# Patient Record
Sex: Female | Born: 1960
Health system: Southern US, Community
[De-identification: ages and names within clinical notes are randomized; demographics above are authoritative.]

---

## 2002-08-29 ENCOUNTER — Other Ambulatory Visit: Admission: RE | Admit: 2002-08-29 | Discharge: 2002-08-29 | Payer: Self-pay | Admitting: *Deleted

## 2012-03-17 ENCOUNTER — Encounter: Payer: Self-pay | Admitting: Internal Medicine

## 2012-05-02 ENCOUNTER — Encounter: Payer: Self-pay | Admitting: Internal Medicine

## 2012-05-11 ENCOUNTER — Encounter: Payer: Self-pay | Admitting: Internal Medicine

## 2013-05-10 ENCOUNTER — Ambulatory Visit (INDEPENDENT_AMBULATORY_CARE_PROVIDER_SITE_OTHER): Payer: BC Managed Care – PPO | Admitting: Sports Medicine

## 2013-05-10 ENCOUNTER — Encounter: Payer: Self-pay | Admitting: Sports Medicine

## 2013-05-10 VITALS — BP 164/92 | Ht 68.0 in | Wt 160.0 lb

## 2013-05-10 DIAGNOSIS — M25561 Pain in right knee: Secondary | ICD-10-CM

## 2013-05-10 DIAGNOSIS — M25569 Pain in unspecified knee: Secondary | ICD-10-CM

## 2013-05-10 NOTE — Progress Notes (Signed)
   Subjective:    Patient ID: Judy Schneider, female    DOB: [birth year], 53 y.o.   MRN: [id number]  HPI chief complaint: Right knee pain  Very pleasant 53 year old female comes in today complaining of 6 months of right knee pain. No injury that she can recall but a gradual onset of pain that began initially while running. As a result, she stopped running but has recently begun to get pain with recreational walking as well. She describes a diffuse ache throughout the knee with some intermittent swelling in the posterior knee. No mechanical symptoms. She does  limp on occasion. She notices that walking downstairs makes her pain worse. Twisting the knee will also occasionally cause it to hurt. Compression seems to help as does over-the-counter Aleve twice daily. She gets occasional nighttime pain. She was having similar pain in the left knee several months ago which resolved spontaneously. No prior knee surgeries. No pain more proximally in the groin. No associated numbness or tingling. She is also an avid cyclist and notes that she has no pain with riding.  She is otherwise healthy. No chronic medical problems. No chronic medications. No prior surgeries. No known drug allergies.     Review of Systems    As above Objective:   Physical Exam Well-developed, well-nourished. No acute distress. Awake alert and oriented x3. Vital signs reviewed.  Right knee: Full range of motion. No effusion. Negative patellar compression test. No tenderness to palpation along the quadriceps or patellar tendons. There is some tenderness to palpation along the posterior lateral joint line with a positive Thessaly and an equivocal McMurray's. No tenderness to palpation along the medial joint line. Knee is stable to valgus and varus stressing. No tenderness along the distal IT band. Knee is stable to valgus and varus stressing. Negative anterior drawer. Negative posterior drawer. No palpable Baker's cyst. Neurovascularly intact  distally. Walking without a limp.  MSK ultrasound of the right knee was performed. Limited images were obtained. Patient has a trace effusion in the suprapatellar pouch. Visualized portion of the medial meniscus showed no obvious tear but there was some bulging from the joint line. Lateral meniscus was well visualized with no obvious abnormality. No obvious Baker's cyst.       Assessment & Plan:  Right knee pain-question degenerative lateral meniscal tear with intermittent Baker's cyst formation  I would like to get some plain x-rays of her knee to evaluate the degree of possible osteoarthritis present. She will start a home exercise program consisting of quadriceps, hamstring, and hip abductor strengthening. Continue with compression as needed. Continue with over-the-counter Aleve as needed. I think she should avoid recreational walking and running until followup with me in 3-4 weeks. She is okay to continue cycling. If symptoms persist a followup I would consider merits of a diagnostic/therapeutic intra-articular cortisone injection. Patient will call with questions or concerns in the interim.

## 2013-05-12 ENCOUNTER — Ambulatory Visit
Admission: RE | Admit: 2013-05-12 | Discharge: 2013-05-12 | Disposition: A | Discharge status: Home / Self Care | Payer: BC Managed Care – PPO | Source: Ambulatory Visit | Attending: Sports Medicine | Admitting: Sports Medicine

## 2013-05-12 DIAGNOSIS — M25561 Pain in right knee: Secondary | ICD-10-CM

## 2013-06-06 ENCOUNTER — Ambulatory Visit (INDEPENDENT_AMBULATORY_CARE_PROVIDER_SITE_OTHER): Payer: BC Managed Care – PPO | Admitting: Sports Medicine

## 2013-06-06 ENCOUNTER — Encounter: Payer: Self-pay | Admitting: Sports Medicine

## 2013-06-06 VITALS — BP 136/89 | Ht 68.0 in | Wt 160.0 lb

## 2013-06-06 DIAGNOSIS — M25569 Pain in unspecified knee: Secondary | ICD-10-CM

## 2013-06-06 DIAGNOSIS — M25579 Pain in unspecified ankle and joints of unspecified foot: Secondary | ICD-10-CM

## 2013-06-06 NOTE — Progress Notes (Signed)
   Subjective:    Patient ID: Judy Schneider, female    DOB: 1960/07/05, 53 y.o.   MRN: [id number]  HPI Patient comes in today for followup on right knee pain. Overall, symptoms persist. She still getting medial sided knee pain with certain activities such as twisting or walking downstairs. Pain is sharp in quality. She has been compliant with her home exercise program but despite that her symptoms persist. She is also complaining of some pain in the left foot. She questions whether or not she may have a Morton's neuroma. She describes several months of intermittent pain that she localizes between the second third metatarsal heads. Intermittent numbness and tingling into the second and third toes. Worse with walking. Improves at rest. Denies pain or numbness proximal to the metatarsal heads. She has not noticed any swelling.    Review of Systems     Objective:   Physical Exam  Right knee: Full range of motion. Trace effusion. She is tender to palpation along the medial joint line with pain but no popping with McMurray's. Positive Thessaly's. No tenderness to palpation along the lateral joint line. Knee is stable to ligamentous exam. No popliteal cyst. Neurovascularly intact distally.  Left foot: Mild tenderness to palpation between the second and third metatarsal heads. Positive Mudlers sign. No soft tissue swelling. No tenderness to palpation directly over the metatarsal heads themselves. Moderate collapse of the transverse arch. Neurovascularly intact distally. Walking without a limp.  X-rays of the right knee from 05/10/2013 are reviewed. Nothing acute. There is a paucity of degenerative change.       Assessment & Plan:  Right knee pain likely secondary to degenerative medial meniscal tear Left foot pain secondary to Morton's neuroma  I discussed her treatment options in regards to her right knee. We discussed the possibility of a cortisone injection versus further diagnostic imaging  versus a prolonged period of watchful waiting. She would like to think things over. I'll have her followup with me in about 4 weeks to discuss these options further. In the meantime, I've given her a pair of green sports insoles with a metatarsal pad on the left to hopefully alleviate some of the discomfort from her probable Morton's neuroma. If this becomes more symptomatic I would consider cortisone injection.

## 2013-07-18 ENCOUNTER — Ambulatory Visit: Payer: BC Managed Care – PPO | Admitting: Sports Medicine

## 2015-10-02 DIAGNOSIS — Z23 Encounter for immunization: Secondary | ICD-10-CM | POA: Diagnosis not present

## 2015-10-17 DIAGNOSIS — Z1231 Encounter for screening mammogram for malignant neoplasm of breast: Secondary | ICD-10-CM | POA: Diagnosis not present

## 2015-10-17 DIAGNOSIS — Z803 Family history of malignant neoplasm of breast: Secondary | ICD-10-CM | POA: Diagnosis not present

## 2015-10-22 DIAGNOSIS — Z713 Dietary counseling and surveillance: Secondary | ICD-10-CM | POA: Diagnosis not present

## 2015-11-18 DIAGNOSIS — Z713 Dietary counseling and surveillance: Secondary | ICD-10-CM | POA: Diagnosis not present

## 2015-12-02 DIAGNOSIS — Z713 Dietary counseling and surveillance: Secondary | ICD-10-CM | POA: Diagnosis not present

## 2015-12-17 DIAGNOSIS — Z713 Dietary counseling and surveillance: Secondary | ICD-10-CM | POA: Diagnosis not present

## 2016-01-14 DIAGNOSIS — Z713 Dietary counseling and surveillance: Secondary | ICD-10-CM | POA: Diagnosis not present

## 2016-01-28 DIAGNOSIS — Z713 Dietary counseling and surveillance: Secondary | ICD-10-CM | POA: Diagnosis not present

## 2016-09-02 DIAGNOSIS — Z713 Dietary counseling and surveillance: Secondary | ICD-10-CM | POA: Diagnosis not present

## 2016-09-15 DIAGNOSIS — Z01419 Encounter for gynecological examination (general) (routine) without abnormal findings: Secondary | ICD-10-CM | POA: Diagnosis not present

## 2016-09-15 DIAGNOSIS — Z130 Encounter for screening for diseases of the blood and blood-forming organs and certain disorders involving the immune mechanism: Secondary | ICD-10-CM | POA: Diagnosis not present

## 2016-09-15 DIAGNOSIS — Z0000 Encounter for general adult medical examination without abnormal findings: Secondary | ICD-10-CM | POA: Diagnosis not present

## 2016-09-15 DIAGNOSIS — Z13220 Encounter for screening for lipoid disorders: Secondary | ICD-10-CM | POA: Diagnosis not present

## 2016-09-15 DIAGNOSIS — Z1151 Encounter for screening for human papillomavirus (HPV): Secondary | ICD-10-CM | POA: Diagnosis not present

## 2016-09-15 DIAGNOSIS — Z6824 Body mass index (BMI) 24.0-24.9, adult: Secondary | ICD-10-CM | POA: Diagnosis not present

## 2016-09-15 DIAGNOSIS — Z131 Encounter for screening for diabetes mellitus: Secondary | ICD-10-CM | POA: Diagnosis not present

## 2016-09-16 DIAGNOSIS — Z713 Dietary counseling and surveillance: Secondary | ICD-10-CM | POA: Diagnosis not present

## 2016-10-14 DIAGNOSIS — Z713 Dietary counseling and surveillance: Secondary | ICD-10-CM | POA: Diagnosis not present

## 2016-10-28 DIAGNOSIS — Z713 Dietary counseling and surveillance: Secondary | ICD-10-CM | POA: Diagnosis not present

## 2016-10-28 DIAGNOSIS — Z803 Family history of malignant neoplasm of breast: Secondary | ICD-10-CM | POA: Diagnosis not present

## 2016-10-28 DIAGNOSIS — Z1231 Encounter for screening mammogram for malignant neoplasm of breast: Secondary | ICD-10-CM | POA: Diagnosis not present

## 2016-11-04 DIAGNOSIS — Z713 Dietary counseling and surveillance: Secondary | ICD-10-CM | POA: Diagnosis not present

## 2016-11-18 DIAGNOSIS — Z713 Dietary counseling and surveillance: Secondary | ICD-10-CM | POA: Diagnosis not present

## 2017-01-13 DIAGNOSIS — Z713 Dietary counseling and surveillance: Secondary | ICD-10-CM | POA: Diagnosis not present

## 2017-01-14 DIAGNOSIS — Z713 Dietary counseling and surveillance: Secondary | ICD-10-CM | POA: Diagnosis not present

## 2017-01-27 DIAGNOSIS — Z713 Dietary counseling and surveillance: Secondary | ICD-10-CM | POA: Diagnosis not present

## 2017-02-10 DIAGNOSIS — Z713 Dietary counseling and surveillance: Secondary | ICD-10-CM | POA: Diagnosis not present

## 2017-02-24 DIAGNOSIS — Z713 Dietary counseling and surveillance: Secondary | ICD-10-CM | POA: Diagnosis not present

## 2017-03-10 DIAGNOSIS — Z713 Dietary counseling and surveillance: Secondary | ICD-10-CM | POA: Diagnosis not present

## 2017-03-24 DIAGNOSIS — Z713 Dietary counseling and surveillance: Secondary | ICD-10-CM | POA: Diagnosis not present

## 2017-04-07 DIAGNOSIS — Z713 Dietary counseling and surveillance: Secondary | ICD-10-CM | POA: Diagnosis not present

## 2017-04-21 DIAGNOSIS — Z713 Dietary counseling and surveillance: Secondary | ICD-10-CM | POA: Diagnosis not present

## 2017-05-05 DIAGNOSIS — Z713 Dietary counseling and surveillance: Secondary | ICD-10-CM | POA: Diagnosis not present

## 2017-05-19 DIAGNOSIS — Z713 Dietary counseling and surveillance: Secondary | ICD-10-CM | POA: Diagnosis not present

## 2017-06-02 DIAGNOSIS — Z713 Dietary counseling and surveillance: Secondary | ICD-10-CM | POA: Diagnosis not present

## 2017-09-28 DIAGNOSIS — Z131 Encounter for screening for diabetes mellitus: Secondary | ICD-10-CM | POA: Diagnosis not present

## 2017-09-28 DIAGNOSIS — Z0000 Encounter for general adult medical examination without abnormal findings: Secondary | ICD-10-CM | POA: Diagnosis not present

## 2017-09-28 DIAGNOSIS — Z6825 Body mass index (BMI) 25.0-25.9, adult: Secondary | ICD-10-CM | POA: Diagnosis not present

## 2017-09-28 DIAGNOSIS — Z13220 Encounter for screening for lipoid disorders: Secondary | ICD-10-CM | POA: Diagnosis not present

## 2017-09-28 DIAGNOSIS — Z130 Encounter for screening for diseases of the blood and blood-forming organs and certain disorders involving the immune mechanism: Secondary | ICD-10-CM | POA: Diagnosis not present

## 2017-09-28 DIAGNOSIS — Z01419 Encounter for gynecological examination (general) (routine) without abnormal findings: Secondary | ICD-10-CM | POA: Diagnosis not present

## 2017-10-01 DIAGNOSIS — Z23 Encounter for immunization: Secondary | ICD-10-CM | POA: Diagnosis not present

## 2017-11-05 DIAGNOSIS — Z803 Family history of malignant neoplasm of breast: Secondary | ICD-10-CM | POA: Diagnosis not present

## 2017-11-05 DIAGNOSIS — Z1231 Encounter for screening mammogram for malignant neoplasm of breast: Secondary | ICD-10-CM | POA: Diagnosis not present

## 2017-11-08 DIAGNOSIS — Z713 Dietary counseling and surveillance: Secondary | ICD-10-CM | POA: Diagnosis not present

## 2017-11-17 DIAGNOSIS — Z713 Dietary counseling and surveillance: Secondary | ICD-10-CM | POA: Diagnosis not present

## 2017-11-18 DIAGNOSIS — Z713 Dietary counseling and surveillance: Secondary | ICD-10-CM | POA: Diagnosis not present

## 2017-11-24 DIAGNOSIS — Z713 Dietary counseling and surveillance: Secondary | ICD-10-CM | POA: Diagnosis not present

## 2017-12-08 DIAGNOSIS — Z713 Dietary counseling and surveillance: Secondary | ICD-10-CM | POA: Diagnosis not present

## 2017-12-22 DIAGNOSIS — Z713 Dietary counseling and surveillance: Secondary | ICD-10-CM | POA: Diagnosis not present

## 2018-01-19 DIAGNOSIS — Z713 Dietary counseling and surveillance: Secondary | ICD-10-CM | POA: Diagnosis not present

## 2018-09-06 ENCOUNTER — Other Ambulatory Visit: Payer: Self-pay

## 2018-09-06 ENCOUNTER — Ambulatory Visit (INDEPENDENT_AMBULATORY_CARE_PROVIDER_SITE_OTHER): Payer: BC Managed Care – PPO | Admitting: Sports Medicine

## 2018-09-06 VITALS — BP 156/91 | Ht 68.0 in | Wt 165.0 lb

## 2018-09-06 DIAGNOSIS — M25551 Pain in right hip: Secondary | ICD-10-CM | POA: Diagnosis not present

## 2018-09-06 MED ORDER — METHYLPREDNISOLONE ACETATE 80 MG/ML IJ SUSP
80.0000 mg | Freq: Once | INTRAMUSCULAR | Status: AC
  Administered 2018-09-06: 80 mg via INTRAMUSCULAR

## 2018-09-06 MED ORDER — MELOXICAM 15 MG PO TABS: ORAL_TABLET | ORAL | 0 refills | Status: DC

## 2018-09-06 MED ORDER — KETOROLAC TROMETHAMINE 60 MG/2ML IM SOLN
60.0000 mg | Freq: Once | INTRAMUSCULAR | Status: AC
  Administered 2018-09-06: 60 mg via INTRAMUSCULAR

## 2018-09-06 NOTE — Progress Notes (Signed)
   Subjective:    Patient ID: Judy Schneider, female    DOB: 1961/01/01, 58 y.o.   MRN: [id number]  CC: R-sided buttock/leg pain  HPI   Patient is a 58 year-old female presenting with (R) buttock and posterior leg pain. She states her pain began about 2 months ago. She denies any inciting event or trauma. She does report that she is very active--walking about 4 miles daily, biking, and doing light weight training. She reports progressive (R) posterior leg pain over these past two months. She states her pain is a a deeper, stabbing pain in her posterior buttock. The pain will radiated down her posterior leg as well. She feels it is "in the muscle" and denies any numbness/tingling. Her pain is worse in the morning when she first wakes up, and when recumbent and when lying on affected leg. She feels a sharp pain down her leg when trying to get out of bed or when swinging her (R) leg getting in/out of bed or car. She has been taking Ibuprofen BID for the last two weeks, but is unsure if this helped.    Review of Systems  Constitutional: Negative for activity change, chills, fatigue, fever and unexpected weight change.  Musculoskeletal: Negative for back pain.  Otherwise negative except listed above in HPI     Objective:   Physical Exam  General - alert, in no acute distress Pulm: breathing unlabored Psych: normal mood, pleasant in conversation MSK - no bony tenderness of step-off of thoracic or lumbar spine; full ROM with lumbar flexion & extension  RLE - no skin erythema or effusion noted. + mild TTP over (R) greater trochanter without STS. + TTP over medial belly of piriformis in (R) posterior buttock. Full active and passive ROM of b/l LE's. 5/5 muscle strength with flexion/extension of b/l LE's. + Diminished strength of RLE with abduction. Sensation grossly intact, + 2/4 DP's b/l. + Trendelenburg of RLE. + FAIR test of RLE. Equivocal log roll of b/l LE's, - FADIR, FABER testing of b/l hips  without impingement. - Straight leg raise.       Assessment & Plan:  1. (R) posterior leg pain 2/2 weak pelvic stabilizer muscles Diminished strength with RLE abduction, + Trendelenburg. -80 mg Depo-Medrol IM and 60 mg Toradol IM for acute pain. - Mobic 15mg  qd x7 days then as needed - pelvic stabilizer muscle strengthening with PT exercises given today - RICE as needed - f/u in 4 weeks to reassess.  If symptoms persist, consider merits of x-ray and formal physical therapy.  2. Piriformis syndrome - (R)-sided  + FAIR test. TTP over medial belly. - counterstrain treatment to piriformis today - at home muscle energy to piriformis daily PRN - PT home exercises as above  Renne Musca, DO   Patient seen and evaluated with the resident.  I agree with the above plan of care.  Treatment as above and follow-up in 4 weeks.  Consider physical therapy if symptoms persist.  Patient will call with questions or concerns in the interim.

## 2018-09-06 NOTE — Patient Instructions (Signed)
  We injected you with 2 different medicines today.  One is a steroid and one is Toradol, a powerful anti-inflammatory.  Go ahead and start the 2 exercises demonstrated today and do them daily.  Be sure to ice the side of your hip after walking.  Ice for about 10 minutes each time.  I am giving you a prescription for Mobic.  Take 1 pill a day for 7 days and then as needed.  Do not start this medicine until tomorrow.  See me again in 4 weeks.  If you are not improving in the meantime, call me and I will refer you to physical therapy.

## 2018-10-06 DIAGNOSIS — Z23 Encounter for immunization: Secondary | ICD-10-CM | POA: Diagnosis not present

## 2018-10-25 DIAGNOSIS — Z713 Dietary counseling and surveillance: Secondary | ICD-10-CM | POA: Diagnosis not present

## 2018-11-09 DIAGNOSIS — Z713 Dietary counseling and surveillance: Secondary | ICD-10-CM | POA: Diagnosis not present

## 2018-11-10 DIAGNOSIS — Z803 Family history of malignant neoplasm of breast: Secondary | ICD-10-CM | POA: Diagnosis not present

## 2018-11-10 DIAGNOSIS — Z1231 Encounter for screening mammogram for malignant neoplasm of breast: Secondary | ICD-10-CM | POA: Diagnosis not present

## 2018-11-23 DIAGNOSIS — Z713 Dietary counseling and surveillance: Secondary | ICD-10-CM | POA: Diagnosis not present

## 2018-12-07 DIAGNOSIS — Z713 Dietary counseling and surveillance: Secondary | ICD-10-CM | POA: Diagnosis not present

## 2018-12-21 DIAGNOSIS — Z713 Dietary counseling and surveillance: Secondary | ICD-10-CM | POA: Diagnosis not present

## 2018-12-28 ENCOUNTER — Ambulatory Visit: Payer: BC Managed Care – PPO | Attending: Internal Medicine

## 2018-12-28 DIAGNOSIS — Z20822 Contact with and (suspected) exposure to covid-19: Secondary | ICD-10-CM

## 2018-12-28 DIAGNOSIS — Z20828 Contact with and (suspected) exposure to other viral communicable diseases: Secondary | ICD-10-CM | POA: Diagnosis not present

## 2018-12-30 LAB — NOVEL CORONAVIRUS, NAA: SARS-CoV-2, NAA: NOT DETECTED

## 2019-01-02 ENCOUNTER — Ambulatory Visit: Payer: BC Managed Care – PPO | Attending: Internal Medicine

## 2019-01-02 DIAGNOSIS — Z20828 Contact with and (suspected) exposure to other viral communicable diseases: Secondary | ICD-10-CM | POA: Diagnosis not present

## 2019-01-02 DIAGNOSIS — Z20822 Contact with and (suspected) exposure to covid-19: Secondary | ICD-10-CM

## 2019-01-04 LAB — NOVEL CORONAVIRUS, NAA: SARS-CoV-2, NAA: NOT DETECTED

## 2019-01-11 DIAGNOSIS — Z713 Dietary counseling and surveillance: Secondary | ICD-10-CM | POA: Diagnosis not present

## 2019-01-18 DIAGNOSIS — Z713 Dietary counseling and surveillance: Secondary | ICD-10-CM | POA: Diagnosis not present

## 2019-01-18 DIAGNOSIS — Z03818 Encounter for observation for suspected exposure to other biological agents ruled out: Secondary | ICD-10-CM | POA: Diagnosis not present

## 2019-07-20 DIAGNOSIS — Z713 Dietary counseling and surveillance: Secondary | ICD-10-CM | POA: Diagnosis not present

## 2019-10-05 DIAGNOSIS — Z23 Encounter for immunization: Secondary | ICD-10-CM | POA: Diagnosis not present

## 2019-11-08 DIAGNOSIS — Z713 Dietary counseling and surveillance: Secondary | ICD-10-CM | POA: Diagnosis not present

## 2019-11-22 DIAGNOSIS — Z1231 Encounter for screening mammogram for malignant neoplasm of breast: Secondary | ICD-10-CM | POA: Diagnosis not present

## 2019-12-06 DIAGNOSIS — Z713 Dietary counseling and surveillance: Secondary | ICD-10-CM | POA: Diagnosis not present

## 2019-12-20 DIAGNOSIS — Z0000 Encounter for general adult medical examination without abnormal findings: Secondary | ICD-10-CM | POA: Diagnosis not present

## 2019-12-20 DIAGNOSIS — Z1329 Encounter for screening for other suspected endocrine disorder: Secondary | ICD-10-CM | POA: Diagnosis not present

## 2019-12-20 DIAGNOSIS — Z01419 Encounter for gynecological examination (general) (routine) without abnormal findings: Secondary | ICD-10-CM | POA: Diagnosis not present

## 2019-12-20 DIAGNOSIS — Z13220 Encounter for screening for lipoid disorders: Secondary | ICD-10-CM | POA: Diagnosis not present

## 2019-12-20 DIAGNOSIS — Z01411 Encounter for gynecological examination (general) (routine) with abnormal findings: Secondary | ICD-10-CM | POA: Diagnosis not present

## 2019-12-20 DIAGNOSIS — Z6829 Body mass index (BMI) 29.0-29.9, adult: Secondary | ICD-10-CM | POA: Diagnosis not present

## 2019-12-20 DIAGNOSIS — Z124 Encounter for screening for malignant neoplasm of cervix: Secondary | ICD-10-CM | POA: Diagnosis not present

## 2019-12-20 DIAGNOSIS — Z131 Encounter for screening for diabetes mellitus: Secondary | ICD-10-CM | POA: Diagnosis not present

## 2019-12-21 DIAGNOSIS — Z713 Dietary counseling and surveillance: Secondary | ICD-10-CM | POA: Diagnosis not present

## 2020-01-10 DIAGNOSIS — Z1159 Encounter for screening for other viral diseases: Secondary | ICD-10-CM | POA: Diagnosis not present

## 2020-01-15 DIAGNOSIS — Z8371 Family history of colonic polyps: Secondary | ICD-10-CM | POA: Diagnosis not present

## 2020-01-15 DIAGNOSIS — Z1211 Encounter for screening for malignant neoplasm of colon: Secondary | ICD-10-CM | POA: Diagnosis not present

## 2020-01-15 DIAGNOSIS — D123 Benign neoplasm of transverse colon: Secondary | ICD-10-CM | POA: Diagnosis not present

## 2020-01-15 LAB — HM COLONOSCOPY

## 2020-01-17 DIAGNOSIS — Z713 Dietary counseling and surveillance: Secondary | ICD-10-CM | POA: Diagnosis not present

## 2020-06-18 ENCOUNTER — Telehealth: Payer: Self-pay

## 2020-06-18 NOTE — Telephone Encounter (Signed)
Yes, I will accept her 

## 2020-06-18 NOTE — Telephone Encounter (Signed)
   Are you okay to see Judy Schneider as new pateint? Her spouse is a current patient with you.

## 2020-07-26 DIAGNOSIS — M9902 Segmental and somatic dysfunction of thoracic region: Secondary | ICD-10-CM | POA: Diagnosis not present

## 2020-07-26 DIAGNOSIS — S134XXA Sprain of ligaments of cervical spine, initial encounter: Secondary | ICD-10-CM | POA: Diagnosis not present

## 2020-07-26 DIAGNOSIS — M9901 Segmental and somatic dysfunction of cervical region: Secondary | ICD-10-CM | POA: Diagnosis not present

## 2020-07-30 ENCOUNTER — Ambulatory Visit
Admission: RE | Admit: 2020-07-30 | Discharge: 2020-07-30 | Disposition: A | Discharge status: Home / Self Care | Payer: BC Managed Care – PPO | Source: Ambulatory Visit | Attending: Sports Medicine | Admitting: Sports Medicine

## 2020-07-30 ENCOUNTER — Other Ambulatory Visit: Payer: Self-pay

## 2020-07-30 ENCOUNTER — Ambulatory Visit (INDEPENDENT_AMBULATORY_CARE_PROVIDER_SITE_OTHER): Payer: BC Managed Care – PPO | Admitting: Sports Medicine

## 2020-07-30 VITALS — BP 128/88 | Ht 67.75 in | Wt 165.0 lb

## 2020-07-30 DIAGNOSIS — M5030 Other cervical disc degeneration, unspecified cervical region: Secondary | ICD-10-CM

## 2020-07-30 DIAGNOSIS — M542 Cervicalgia: Secondary | ICD-10-CM | POA: Diagnosis not present

## 2020-07-30 MED ORDER — PREDNISONE 10 MG PO TABS: ORAL_TABLET | ORAL | 0 refills | Status: DC

## 2020-07-30 MED ORDER — CYCLOBENZAPRINE HCL 10 MG PO TABS: ORAL_TABLET | ORAL | 0 refills | Status: DC

## 2020-07-30 NOTE — Patient Instructions (Signed)
Follow up in one week

## 2020-07-31 NOTE — Progress Notes (Signed)
   Subjective:    Patient ID: Judy Schneider, female    DOB: 11-07-1960, 60 y.o.   MRN: [id number]  HPI chief complaint: Neck pain  Judy Schneider is a very pleasant 60 year old female that comes in today complaining of neck pain has been present for 10 days.  Symptoms began without any known trauma.  Despite her pain she has tried to remain active.  Pain is all localized to the central portion of her neck with radiating pain and tingling into the paraspinal musculature and up into the occiput.  She saw a chiropractor last week without any relief of symptoms.  She has been taking 800 mg of ibuprofen as well as some Flexeril at night.  These helped temporarily.  She denies any significant problems with her neck in the past.  She denies any past trauma to the neck.  No numbness or tingling into the arms.  No pain in the arms.  Past medical history reviewed Medications reviewed Allergies reviewed    Review of Systems As above    Objective:   Physical Exam  Well-developed, well-nourished.  No acute distress  Cervical spine: Patient has limited cervical range of motion in all planes, particularly with cervical rotation to the left and right.  She has no tenderness to palpation along the midline.  No tenderness or spasm of the paraspinal musculature.  Neurological exam of both upper extremities shows brisk reflexes at the biceps, triceps, and brachioradialis tendons.  No obvious strength deficit.  No atrophy.  X-rays of her cervical spine including AP and lateral views show significant disc space narrowing at C5-C6 as well as advanced facet arthropathy in this area.  Please note that the radiology report was pending at the time of this dictation.      Assessment & Plan:   Neck pain secondary to cervical degenerative disc disease versus facet arthropathy  6-day Sterapred Dosepak to take as directed.  She is instructed not to take any ibuprofen during this time.  I would also like for her to begin  physical therapy with Ellamae Sia and follow-up with me again in 1 week.  If symptoms persist then we may need to consider an MRI in anticipation of cervical spine injections.  In the meantime, I will refill her Flexeril with instructions to take 1/2-1 10 mg tablet as needed at bedtime.

## 2020-08-06 ENCOUNTER — Ambulatory Visit (INDEPENDENT_AMBULATORY_CARE_PROVIDER_SITE_OTHER): Payer: BC Managed Care – PPO | Admitting: Sports Medicine

## 2020-08-06 ENCOUNTER — Other Ambulatory Visit: Payer: Self-pay

## 2020-08-06 VITALS — Ht 67.75 in | Wt 165.0 lb

## 2020-08-06 DIAGNOSIS — M5030 Other cervical disc degeneration, unspecified cervical region: Secondary | ICD-10-CM

## 2020-08-06 DIAGNOSIS — L814 Other melanin hyperpigmentation: Secondary | ICD-10-CM | POA: Diagnosis not present

## 2020-08-06 DIAGNOSIS — D225 Melanocytic nevi of trunk: Secondary | ICD-10-CM | POA: Diagnosis not present

## 2020-08-06 DIAGNOSIS — L570 Actinic keratosis: Secondary | ICD-10-CM | POA: Diagnosis not present

## 2020-08-06 DIAGNOSIS — L918 Other hypertrophic disorders of the skin: Secondary | ICD-10-CM | POA: Diagnosis not present

## 2020-08-06 DIAGNOSIS — L578 Other skin changes due to chronic exposure to nonionizing radiation: Secondary | ICD-10-CM | POA: Diagnosis not present

## 2020-08-06 NOTE — Progress Notes (Signed)
   Subjective:    Patient ID: Judy Schneider, female    DOB: 15-May-1960, 60 y.o.   MRN: [id number]  HPI  Judy Schneider comes in today for follow-up on neck pain.  X-rays of her cervical spine shows moderate to marked degenerative changes at the level of C5-C6 both at the facet joints and the disc space.  Her symptoms have improved dramatically after a 6-day Sterapred Dosepak.  She is no longer needing Flexeril.  She has yet to start physical therapy.  She is planning a trip in a few days to visit some friends and is asking questions about what sorts of activities she should be attempting at this time.   Review of Systems As above    Objective:   Physical Exam  Well-developed, well-nourished.  No acute distress  Cervical spine: Much improved range of motion today compared to last week.  She still lacks about 30 degrees of cervical rotation to each side.  Good flexion and extension.  No tenderness to palpation.  No spasm.  X-rays of the cervical spine as above     Assessment & Plan:   Improved neck pain secondary to C5-C6 cervical degenerative disc disease and arthropathy  Patient is pleased with her progress to date.  I still think she should attend physical therapy for instruction in a home exercise program.  I have cautioned her against any sort of activity which may cause a lot of jarring of her neck such as cycling or kayaking while on vacation.  Once she returns to [facility], she may start to resume all of her previous activities as symptoms allow.  If symptoms return then we will reconsider merits of an MRI in anticipation of cervical spine injections but we can obviously wait on that for now.  She will follow-up with me as needed.

## 2020-08-22 DIAGNOSIS — M542 Cervicalgia: Secondary | ICD-10-CM | POA: Diagnosis not present

## 2020-08-28 DIAGNOSIS — H5203 Hypermetropia, bilateral: Secondary | ICD-10-CM | POA: Diagnosis not present

## 2020-08-28 DIAGNOSIS — H524 Presbyopia: Secondary | ICD-10-CM | POA: Diagnosis not present

## 2020-09-05 DIAGNOSIS — M542 Cervicalgia: Secondary | ICD-10-CM | POA: Diagnosis not present

## 2020-10-04 DIAGNOSIS — Z23 Encounter for immunization: Secondary | ICD-10-CM | POA: Diagnosis not present

## 2020-10-14 NOTE — Progress Notes (Deleted)
      Subjective:    Patient ID: Judy Schneider, female    DOB: 12-Jul-1960, 60 y.o.   MRN: [id number]  This visit occurred during the SARS-CoV-2 public health emergency.  Safety protocols were in place, including screening questions prior to the visit, additional usage of staff PPE, and extensive cleaning of exam room while observing appropriate contact time as indicated for disinfecting solutions.     HPI The patient is here for follow up of their chronic medical problems, including     Medications and allergies reviewed with patient and updated if appropriate.  There are no problems to display for this patient.   Current Outpatient Medications on File Prior to Visit  Medication Sig Dispense Refill   cyclobenzaprine (FLEXERIL) 10 MG tablet Take 1/2 to 1 tab at bedtime as needed 30 tablet 0   meloxicam (MOBIC) 15 MG tablet Take 1 tablet daily with food for 7 days. Then take as needed. (Patient not taking: Reported on 07/30/2020) 40 tablet 0   predniSONE (DELTASONE) 10 MG tablet Take as directed per MD instructions 21 tablet 0   No current facility-administered medications on file prior to visit.    No past medical history on file.  *** The histories are not reviewed yet. Please review them in the "History" navigator section and refresh this SmartLink.  Social History   Socioeconomic History   Marital status: Married    Spouse name: Not on file   Number of children: Not on file   Years of education: Not on file   Highest education level: Not on file  Occupational History   Not on file  Tobacco Use   Smoking status: Never   Smokeless tobacco: Not on file  Substance and Sexual Activity   Alcohol use: Not on file   Drug use: Not on file   Sexual activity: Not on file  Other Topics Concern   Not on file  Social History Narrative   Not on file   Social Determinants of Health   Financial Resource Strain: Not on file  Food Insecurity: Not on file  Transportation Needs:  Not on file  Physical Activity: Not on file  Stress: Not on file  Social Connections: Not on file    No family history on file.  Review of Systems     Objective:  There were no vitals filed for this visit. BP Readings from Last 3 Encounters:  07/30/20 128/88  09/06/18 (!) 156/91  06/06/13 136/89   Wt Readings from Last 3 Encounters:  08/06/20 165 lb (74.8 kg)  07/30/20 165 lb (74.8 kg)  09/06/18 165 lb (74.8 kg)   There is no height or weight on file to calculate BMI.   Physical Exam    Constitutional: Appears well-developed and well-nourished. No distress.  HENT:  Head: Normocephalic and atraumatic.  Neck: Neck supple. No tracheal deviation present. No thyromegaly present.  No cervical lymphadenopathy Cardiovascular: Normal rate, regular rhythm and normal heart sounds.   No murmur heard. No carotid bruit .  No edema Pulmonary/Chest: Effort normal and breath sounds normal. No respiratory distress. No has no wheezes. No rales.  Skin: Skin is warm and dry. Not diaphoretic.  Psychiatric: Normal mood and affect. Behavior is normal.      Assessment & Plan:    See Problem List for Assessment and Plan of chronic medical problems.

## 2020-10-15 ENCOUNTER — Other Ambulatory Visit: Payer: Self-pay

## 2020-10-15 ENCOUNTER — Encounter: Payer: Self-pay | Admitting: Internal Medicine

## 2020-10-15 ENCOUNTER — Ambulatory Visit (INDEPENDENT_AMBULATORY_CARE_PROVIDER_SITE_OTHER): Payer: BC Managed Care – PPO | Admitting: Internal Medicine

## 2020-10-15 VITALS — BP 128/72 | HR 76 | Temp 97.9°F | Ht 67.75 in | Wt 169.0 lb

## 2020-10-15 DIAGNOSIS — E785 Hyperlipidemia, unspecified: Secondary | ICD-10-CM | POA: Insufficient documentation

## 2020-10-15 DIAGNOSIS — E782 Mixed hyperlipidemia: Secondary | ICD-10-CM | POA: Diagnosis not present

## 2020-10-15 DIAGNOSIS — Z1331 Encounter for screening for depression: Secondary | ICD-10-CM | POA: Diagnosis not present

## 2020-10-15 DIAGNOSIS — Z0000 Encounter for general adult medical examination without abnormal findings: Secondary | ICD-10-CM | POA: Diagnosis not present

## 2020-10-15 LAB — COMPREHENSIVE METABOLIC PANEL
ALT: 27 U/L (ref 0–35)
AST: 23 U/L (ref 0–37)
Albumin: 4.7 g/dL (ref 3.5–5.2)
Alkaline Phosphatase: 57 U/L (ref 39–117)
BUN: 16 mg/dL (ref 6–23)
CO2: 28 mEq/L (ref 19–32)
Calcium: 9.8 mg/dL (ref 8.4–10.5)
Chloride: 104 mEq/L (ref 96–112)
Creatinine, Ser: 0.67 mg/dL (ref 0.40–1.20)
GFR: 94.96 mL/min (ref >60.00)
Glucose, Bld: 97 mg/dL (ref 70–99)
Potassium: 4.3 mEq/L (ref 3.5–5.1)
Sodium: 140 mEq/L (ref 135–145)
Total Bilirubin: 0.5 mg/dL (ref 0.2–1.2)
Total Protein: 7.5 g/dL (ref 6.0–8.3)

## 2020-10-15 LAB — CBC WITH DIFFERENTIAL/PLATELET
Basophils Absolute: 0 10*3/uL (ref 0.0–0.1)
Basophils Relative: 0.6 % (ref 0.0–3.0)
Eosinophils Absolute: 0 10*3/uL (ref 0.0–0.7)
Eosinophils Relative: 0.8 % (ref 0.0–5.0)
HCT: 39.9 % (ref 36.0–46.0)
Hemoglobin: 13.5 g/dL (ref 12.0–15.0)
Lymphocytes Relative: 41.9 % (ref 12.0–46.0)
Lymphs Abs: 2.2 10*3/uL (ref 0.7–4.0)
MCHC: 33.7 g/dL (ref 30.0–36.0)
MCV: 86.2 fl (ref 78.0–100.0)
Monocytes Absolute: 0.3 10*3/uL (ref 0.1–1.0)
Monocytes Relative: 6.4 % (ref 3.0–12.0)
Neutro Abs: 2.7 10*3/uL (ref 1.4–7.7)
Neutrophils Relative %: 50.3 % (ref 43.0–77.0)
Platelets: 285 10*3/uL (ref 150.0–400.0)
RBC: 4.63 Mil/uL (ref 3.87–5.11)
RDW: 13.9 % (ref 11.5–15.5)
WBC: 5.3 10*3/uL (ref 4.0–10.5)

## 2020-10-15 LAB — LIPID PANEL
Cholesterol: 303 mg/dL — ABNORMAL HIGH (ref 0–200)
HDL: 70.1 mg/dL (ref >39.00)
LDL Cholesterol: 211 mg/dL — ABNORMAL HIGH (ref 0–99)
NonHDL: 232.75
Total CHOL/HDL Ratio: 4
Triglycerides: 110 mg/dL (ref 0.0–149.0)
VLDL: 22 mg/dL (ref 0.0–40.0)

## 2020-10-15 LAB — TSH: TSH: 1.02 u[IU]/mL (ref 0.35–5.50)

## 2020-10-15 MED ORDER — COENZYME Q10 30 MG PO CAPS: 30.0000 mg | ORAL_CAPSULE | Freq: Three times a day (TID) | ORAL | Status: DC

## 2020-10-15 MED ORDER — MAGNESIUM 100 MG PO CAPS: ORAL_CAPSULE | ORAL | 0 refills | Status: AC

## 2020-10-15 MED ORDER — ZINC 100 MG PO TABS: ORAL_TABLET | ORAL | 0 refills | Status: DC

## 2020-10-15 MED ORDER — VITAMIN D3 25 MCG (1000 UT) PO CAPS: 1000.0000 [IU] | ORAL_CAPSULE | Freq: Every day | ORAL | Status: DC

## 2020-10-15 MED ORDER — FISH OIL 1000 MG PO CAPS: ORAL_CAPSULE | ORAL | 0 refills | Status: DC

## 2020-10-15 MED ORDER — B COMPLEX VITAMINS PO CAPS: ORAL_CAPSULE | ORAL | Status: DC

## 2020-10-15 NOTE — Patient Instructions (Addendum)
Blood work was ordered.     Medications changes include :   none    Please followup in 1 year   Health Maintenance, Female Adopting a healthy lifestyle and getting preventive care are important in promoting health and wellness. Ask your health care provider about: The right schedule for you to have regular tests and exams. Things you can do on your own to prevent diseases and keep yourself healthy. What should I know about diet, weight, and exercise? Eat a healthy diet  Eat a diet that includes plenty of vegetables, fruits, low-fat dairy products, and lean protein. Do not eat a lot of foods that are high in solid fats, added sugars, or sodium. Maintain a healthy weight Body mass index (BMI) is used to identify weight problems. It estimates body fat based on height and weight. Your health care provider can help determine your BMI and help you achieve or maintain a healthy weight. Get regular exercise Get regular exercise. This is one of the most important things you can do for your health. Most adults should: Exercise for at least 150 minutes each week. The exercise should increase your heart rate and make you sweat (moderate-intensity exercise). Do strengthening exercises at least twice a week. This is in addition to the moderate-intensity exercise. Spend less time sitting. Even light physical activity can be beneficial. Watch cholesterol and blood lipids Have your blood tested for lipids and cholesterol at 60 years of age, then have this test every 5 years. Have your cholesterol levels checked more often if: Your lipid or cholesterol levels are high. You are older than 60 years of age. You are at high risk for heart disease. What should I know about cancer screening? Depending on your health history and family history, you may need to have cancer screening at various ages. This may include screening for: Breast cancer. Cervical cancer. Colorectal cancer. Skin cancer. Lung  cancer. What should I know about heart disease, diabetes, and high blood pressure? Blood pressure and heart disease High blood pressure causes heart disease and increases the risk of stroke. This is more likely to develop in people who have high blood pressure readings, are of African descent, or are overweight. Have your blood pressure checked: Every 3-5 years if you are 18-39 years of age. Every year if you are 40 years old or older. Diabetes Have regular diabetes screenings. This checks your fasting blood sugar level. Have the screening done: Once every three years after age 40 if you are at a normal weight and have a low risk for diabetes. More often and at a younger age if you are overweight or have a high risk for diabetes. What should I know about preventing infection? Hepatitis B If you have a higher risk for hepatitis B, you should be screened for this virus. Talk with your health care provider to find out if you are at risk for hepatitis B infection. Hepatitis C Testing is recommended for: Everyone born from 1945 through 1965. Anyone with known risk factors for hepatitis C. Sexually transmitted infections (STIs) Get screened for STIs, including gonorrhea and chlamydia, if: You are sexually active and are younger than 60 years of age. You are older than 60 years of age and your health care provider tells you that you are at risk for this type of infection. Your sexual activity has changed since you were last screened, and you are at increased risk for chlamydia or gonorrhea. Ask your health care provider if you are   at risk. Ask your health care provider about whether you are at high risk for HIV. Your health care provider may recommend a prescription medicine to help prevent HIV infection. If you choose to take medicine to prevent HIV, you should first get tested for HIV. You should then be tested every 3 months for as long as you are taking the medicine. Pregnancy If you are about  to stop having your period (premenopausal) and you may become pregnant, seek counseling before you get pregnant. Take 400 to 800 micrograms (mcg) of folic acid every day if you become pregnant. Ask for birth control (contraception) if you want to prevent pregnancy. Osteoporosis and menopause Osteoporosis is a disease in which the bones lose minerals and strength with aging. This can result in bone fractures. If you are 65 years old or older, or if you are at risk for osteoporosis and fractures, ask your health care provider if you should: Be screened for bone loss. Take a calcium or vitamin D supplement to lower your risk of fractures. Be given hormone replacement therapy (HRT) to treat symptoms of menopause. Follow these instructions at home: Lifestyle Do not use any products that contain nicotine or tobacco, such as cigarettes, e-cigarettes, and chewing tobacco. If you need help quitting, ask your health care provider. Do not use street drugs. Do not share needles. Ask your health care provider for help if you need support or information about quitting drugs. Alcohol use Do not drink alcohol if: Your health care provider tells you not to drink. You are pregnant, may be pregnant, or are planning to become pregnant. If you drink alcohol: Limit how much you use to 0-1 drink a day. Limit intake if you are breastfeeding. Be aware of how much alcohol is in your drink. In the [place] one drink equals one 12 oz bottle of beer (355 mL), one 5 oz glass of wine (148 mL), or one 1 oz glass of hard liquor (44 mL). General instructions Schedule regular health, dental, and eye exams. Stay current with your vaccines. Tell your health care provider if: You often feel depressed. You have ever been abused or do not feel safe at home. Summary Adopting a healthy lifestyle and getting preventive care are important in promoting health and wellness. Follow your health care provider's instructions about  healthy diet, exercising, and getting tested or screened for diseases. Follow your health care provider's instructions on monitoring your cholesterol and blood pressure. This information is not intended to replace advice given to you by your health care provider. Make sure you discuss any questions you have with your health care provider. Document Revised: 03/01/2020 Document Reviewed: 12/15/2017 Elsevier Patient Education  2022 [employer]  

## 2020-10-15 NOTE — Assessment & Plan Note (Addendum)
Chronic ? Family history Check lipids, tsh Continue regular exercise, healthy diet Can consider red yeast rice Can consider Ct coronary artery calcium score Would like to avoid a statin if possible

## 2020-10-15 NOTE — Progress Notes (Signed)
Subjective:    Patient ID: Judy Schneider, female    DOB: March 25, 1960, 60 y.o.   MRN: [id number]   This visit occurred during the SARS-CoV-2 public health emergency.  Safety protocols were in place, including screening questions prior to the visit, additional usage of staff PPE, and extensive cleaning of exam room while observing appropriate contact time as indicated for disinfecting solutions.    HPI She is here to establish with a new pcp.  She is here for a physical exam.   She is concerned about her cholesterol which has been elevated in the past.  She would like to avoid medication.   Medications and allergies reviewed with patient and updated if appropriate.  Patient Active Problem List   Diagnosis Date Noted   Hyperlipidemia 10/15/2020    No current outpatient medications on file prior to visit.   No current facility-administered medications on file prior to visit.    History reviewed. No pertinent past medical history.  History reviewed. No pertinent surgical history.  Social History   Socioeconomic History   Marital status: Married    Spouse name: Not on file   Number of children: Not on file   Years of education: Not on file   Highest education level: Not on file  Occupational History   Not on file  Tobacco Use   Smoking status: Never    Passive exposure: Never   Smokeless tobacco: Never  Vaping Use   Vaping Use: Never used  Substance and Sexual Activity   Alcohol use: Yes    Comment: social   Drug use: Never   Sexual activity: Not on file  Other Topics Concern   Not on file  Social History Narrative   Not on file   Social Determinants of Health   Financial Resource Strain: Not on file  Food Insecurity: Not on file  Transportation Needs: Not on file  Physical Activity: Not on file  Stress: Not on file  Social Connections: Not on file    Family History  Problem Relation Age of Onset   Hyperlipidemia Father    Alzheimer's disease Father      Review of Systems  Constitutional:  Negative for chills, fatigue and fever.  Eyes:  Negative for visual disturbance.  Respiratory:  Negative for cough, shortness of breath and wheezing.   Cardiovascular:  Negative for chest pain, palpitations and leg swelling.  Gastrointestinal:  Negative for abdominal pain, blood in stool, constipation, diarrhea and nausea.       No gerd  Genitourinary:  Negative for dysuria.  Musculoskeletal:  Negative for arthralgias and back pain.  Skin:  Negative for rash.  Neurological:  Negative for light-headedness and headaches.  Psychiatric/Behavioral:  Negative for dysphoric mood and sleep disturbance. The patient is not nervous/anxious.       Objective:   Vitals:   10/15/20 0829  BP: 128/72  Pulse: 76  Temp: 97.9 F (36.6 C)  SpO2: 99%   Filed Weights   10/15/20 0829  Weight: 169 lb (76.7 kg)   Body mass index is 25.89 kg/m.  BP Readings from Last 3 Encounters:  10/15/20 128/72  07/30/20 128/88  09/06/18 (!) 156/91    Wt Readings from Last 3 Encounters:  10/15/20 169 lb (76.7 kg)  08/06/20 165 lb (74.8 kg)  07/30/20 165 lb (74.8 kg)    Depression screen PHQ 2/9 10/15/2020  Decreased Interest 0  Down, Depressed, Hopeless 0  PHQ - 2 Score 0  Altered sleeping 0  Tired, decreased energy 0  Change in appetite 0  Feeling bad or failure about yourself  0  Trouble concentrating 0  Moving slowly or fidgety/restless 0  Suicidal thoughts 0  PHQ-9 Score 0    GAD 7 : Generalized Anxiety Score 10/15/2020  Nervous, Anxious, on Edge 0  Control/stop worrying 0  Worry too much - different things 0  Trouble relaxing 0  Restless 0  Easily annoyed or irritable 0  Afraid - awful might happen 0  Total GAD 7 Score 0       Physical Exam Constitutional: She appears well-developed and well-nourished. No distress.  HENT:  Head: Normocephalic and atraumatic.  Right Ear: External ear normal. Normal ear canal and TM Left Ear: External ear  normal.  Normal ear canal and TM Mouth/Throat: Oropharynx is clear and moist.  Eyes: Conjunctivae and EOM are normal.  Neck: Neck supple. No tracheal deviation present. No thyromegaly present.  No carotid bruit  Cardiovascular: Normal rate, regular rhythm and normal heart sounds.   No murmur heard.  No edema. Pulmonary/Chest: Effort normal and breath sounds normal. No respiratory distress. She has no wheezes. She has no rales.  Breast: deferred   Abdominal: Soft. She exhibits no distension. There is no tenderness.  Lymphadenopathy: She has no cervical adenopathy.  Skin: Skin is warm and dry. She is not diaphoretic.  Psychiatric: She has a normal mood and affect. Her behavior is normal.     Lab Results  Component Value Date   WBC 5.3 10/15/2020   HGB 13.5 10/15/2020   HCT 39.9 10/15/2020   PLT 285.0 10/15/2020   GLUCOSE 97 10/15/2020   CHOL 303 (H) 10/15/2020   TRIG 110.0 10/15/2020   HDL 70.10 10/15/2020   LDLCALC 211 (H) 10/15/2020   ALT 27 10/15/2020   AST 23 10/15/2020   NA 140 10/15/2020   K 4.3 10/15/2020   CL 104 10/15/2020   CREATININE 0.67 10/15/2020   BUN 16 10/15/2020   CO2 28 10/15/2020   TSH 1.02 10/15/2020         Assessment & Plan:   Physical exam: Screening blood work  ordered Exercise  regular - daily Weight  normal Substance abuse  none Eye exams up to date   Screened for depression using the PHQ 9 scale.  No evidence of depression.   Screened for anxiety using GAD7 Scale.  No evidence of anxiety.   Reviewed recommended immunizations.   Health Maintenance  Topic Date Due   COVID-19 Vaccine (1) Never done   PAP SMEAR-Modifier  Never done   COLONOSCOPY (Pts 45-35yrs Insurance coverage will need to be confirmed)  Never done   MAMMOGRAM  Never done   Zoster Vaccines- Shingrix (1 of 2) Never done   INFLUENZA VACCINE  04/04/2021 (Originally 08/05/2020)   TETANUS/TDAP  10/15/2021 (Originally 04/22/1979)   HPV VACCINES  Aged Out   Hepatitis C  Screening  Discontinued   HIV Screening  Discontinued          See Problem List for Assessment and Plan of chronic medical problems.

## 2020-10-17 ENCOUNTER — Encounter: Payer: Self-pay | Admitting: Internal Medicine

## 2020-10-31 DIAGNOSIS — Z713 Dietary counseling and surveillance: Secondary | ICD-10-CM | POA: Diagnosis not present

## 2020-11-12 DIAGNOSIS — Z713 Dietary counseling and surveillance: Secondary | ICD-10-CM | POA: Diagnosis not present

## 2020-11-21 DIAGNOSIS — Z713 Dietary counseling and surveillance: Secondary | ICD-10-CM | POA: Diagnosis not present

## 2020-11-26 DIAGNOSIS — Z713 Dietary counseling and surveillance: Secondary | ICD-10-CM | POA: Diagnosis not present

## 2020-12-06 DIAGNOSIS — Z1231 Encounter for screening mammogram for malignant neoplasm of breast: Secondary | ICD-10-CM | POA: Diagnosis not present

## 2020-12-06 LAB — HM MAMMOGRAPHY

## 2020-12-11 DIAGNOSIS — Z713 Dietary counseling and surveillance: Secondary | ICD-10-CM | POA: Diagnosis not present

## 2021-01-07 ENCOUNTER — Encounter: Payer: Self-pay | Admitting: Internal Medicine

## 2021-01-07 DIAGNOSIS — Z713 Dietary counseling and surveillance: Secondary | ICD-10-CM | POA: Diagnosis not present

## 2021-01-21 DIAGNOSIS — Z713 Dietary counseling and surveillance: Secondary | ICD-10-CM | POA: Diagnosis not present

## 2021-01-29 ENCOUNTER — Encounter: Payer: Self-pay | Admitting: Internal Medicine

## 2021-01-29 DIAGNOSIS — E782 Mixed hyperlipidemia: Secondary | ICD-10-CM

## 2021-02-04 ENCOUNTER — Other Ambulatory Visit (INDEPENDENT_AMBULATORY_CARE_PROVIDER_SITE_OTHER): Payer: BC Managed Care – PPO

## 2021-02-04 ENCOUNTER — Other Ambulatory Visit: Payer: Self-pay

## 2021-02-04 DIAGNOSIS — E782 Mixed hyperlipidemia: Secondary | ICD-10-CM | POA: Diagnosis not present

## 2021-02-04 LAB — LIPID PANEL
Cholesterol: 208 mg/dL — ABNORMAL HIGH (ref 0–200)
HDL: 52.6 mg/dL (ref >39.00)
LDL Cholesterol: 141 mg/dL — ABNORMAL HIGH (ref 0–99)
NonHDL: 155.26
Total CHOL/HDL Ratio: 4
Triglycerides: 70 mg/dL (ref 0.0–149.0)
VLDL: 14 mg/dL (ref 0.0–40.0)

## 2021-02-05 ENCOUNTER — Encounter: Payer: Self-pay | Admitting: Internal Medicine

## 2021-11-03 ENCOUNTER — Encounter: Payer: Self-pay | Admitting: Internal Medicine

## 2021-11-03 NOTE — Patient Instructions (Addendum)
Tetanus and flu vaccine given today.    Blood work was ordered.   The lab is on the first floor.    Medications changes include :   none    Return in about 1 year (around 11/05/2022) for Physical Exam.   Health Maintenance, Female Adopting a healthy lifestyle and getting preventive care are important in promoting health and wellness. Ask your health care provider about: The right schedule for you to have regular tests and exams. Things you can do on your own to prevent diseases and keep yourself healthy. What should I know about diet, weight, and exercise? Eat a healthy diet  Eat a diet that includes plenty of vegetables, fruits, low-fat dairy products, and lean protein. Do not eat a lot of foods that are high in solid fats, added sugars, or sodium. Maintain a healthy weight Body mass index (BMI) is used to identify weight problems. It estimates body fat based on height and weight. Your health care provider can help determine your BMI and help you achieve or maintain a healthy weight. Get regular exercise Get regular exercise. This is one of the most important things you can do for your health. Most adults should: Exercise for at least 150 minutes each week. The exercise should increase your heart rate and make you sweat (moderate-intensity exercise). Do strengthening exercises at least twice a week. This is in addition to the moderate-intensity exercise. Spend less time sitting. Even light physical activity can be beneficial. Watch cholesterol and blood lipids Have your blood tested for lipids and cholesterol at 61 years of age, then have this test every 5 years. Have your cholesterol levels checked more often if: Your lipid or cholesterol levels are high. You are older than 61 years of age. You are at high risk for heart disease. What should I know about cancer screening? Depending on your health history and family history, you may need to have cancer screening at various  ages. This may include screening for: Breast cancer. Cervical cancer. Colorectal cancer. Skin cancer. Lung cancer. What should I know about heart disease, diabetes, and high blood pressure? Blood pressure and heart disease High blood pressure causes heart disease and increases the risk of stroke. This is more likely to develop in people who have high blood pressure readings or are overweight. Have your blood pressure checked: Every 3-5 years if you are 62-6 years of age. Every year if you are 69 years old or older. Diabetes Have regular diabetes screenings. This checks your fasting blood sugar level. Have the screening done: Once every three years after age 22 if you are at a normal weight and have a low risk for diabetes. More often and at a younger age if you are overweight or have a high risk for diabetes. What should I know about preventing infection? Hepatitis B If you have a higher risk for hepatitis B, you should be screened for this virus. Talk with your health care provider to find out if you are at risk for hepatitis B infection. Hepatitis C Testing is recommended for: Everyone born from [birth year] through 1965. Anyone with known risk factors for hepatitis C. Sexually transmitted infections (STIs) Get screened for STIs, including gonorrhea and chlamydia, if: You are sexually active and are younger than 61 years of age. You are older than 61 years of age and your health care provider tells you that you are at risk for this type of infection. Your sexual activity has changed since you were last  screened, and you are at increased risk for chlamydia or gonorrhea. Ask your health care provider if you are at risk. Ask your health care provider about whether you are at high risk for HIV. Your health care provider may recommend a prescription medicine to help prevent HIV infection. If you choose to take medicine to prevent HIV, you should first get tested for HIV. You should then be tested  every 3 months for as long as you are taking the medicine. Pregnancy If you are about to stop having your period (premenopausal) and you may become pregnant, seek counseling before you get pregnant. Take 400 to 800 micrograms (mcg) of folic acid every day if you become pregnant. Ask for birth control (contraception) if you want to prevent pregnancy. Osteoporosis and menopause Osteoporosis is a disease in which the bones lose minerals and strength with aging. This can result in bone fractures. If you are 55 years old or older, or if you are at risk for osteoporosis and fractures, ask your health care provider if you should: Be screened for bone loss. Take a calcium or vitamin D supplement to lower your risk of fractures. Be given hormone replacement therapy (HRT) to treat symptoms of menopause. Follow these instructions at home: Alcohol use Do not drink alcohol if: Your health care provider tells you not to drink. You are pregnant, may be pregnant, or are planning to become pregnant. If you drink alcohol: Limit how much you have to: 0-1 drink a day. Know how much alcohol is in your drink. In the [place] one drink equals one 12 oz bottle of beer (355 mL), one 5 oz glass of wine (148 mL), or one 1 oz glass of hard liquor (44 mL). Lifestyle Do not use any products that contain nicotine or tobacco. These products include cigarettes, chewing tobacco, and vaping devices, such as e-cigarettes. If you need help quitting, ask your health care provider. Do not use street drugs. Do not share needles. Ask your health care provider for help if you need support or information about quitting drugs. General instructions Schedule regular health, dental, and eye exams. Stay current with your vaccines. Tell your health care provider if: You often feel depressed. You have ever been abused or do not feel safe at home. Summary Adopting a healthy lifestyle and getting preventive care are important in promoting  health and wellness. Follow your health care provider's instructions about healthy diet, exercising, and getting tested or screened for diseases. Follow your health care provider's instructions on monitoring your cholesterol and blood pressure. This information is not intended to replace advice given to you by your health care provider. Make sure you discuss any questions you have with your health care provider. Document Revised: 05/13/2020 Document Reviewed: 05/13/2020 Elsevier Patient Education  2023 [employer].

## 2021-11-03 NOTE — Progress Notes (Unsigned)
Subjective:    Patient ID: Judy Schneider, female    DOB: 04/30/1960, 61 y.o.   MRN: [id number]      HPI Judy Schneider is here for a Physical exam.   No changes in health.  No concerns.    Medications and allergies reviewed with patient and updated if appropriate.  Current Outpatient Medications on File Prior to Visit  Medication Sig Dispense Refill   co-enzyme Q-10 30 MG capsule Take 1 capsule (30 mg total) by mouth 3 (three) times daily.     Magnesium 100 MG CAPS Taking daily  0   Omega-3 Fatty Acids (FISH OIL) 1000 MG CAPS Taking dialy  0   Red Yeast Rice 600 MG CAPS      No current facility-administered medications on file prior to visit.    Review of Systems  Constitutional:  Negative for fever.  Eyes:  Negative for visual disturbance.  Respiratory:  Negative for cough, shortness of breath and wheezing.   Cardiovascular:  Negative for chest pain, palpitations and leg swelling.  Gastrointestinal:  Negative for abdominal pain, blood in stool, constipation, diarrhea and nausea.       No gerd  Genitourinary:  Negative for dysuria.  Musculoskeletal:  Positive for arthralgias (knee b/l achy). Negative for back pain.  Skin:  Negative for rash.  Neurological:  Negative for light-headedness and headaches.  Psychiatric/Behavioral:  Negative for dysphoric mood. The patient is not nervous/anxious.        Objective:   Vitals:   11/04/21 0749  BP: 126/74  Pulse: 74  Temp: 98.1 F (36.7 C)  SpO2: 98%   Filed Weights   11/04/21 0749  Weight: 168 lb (76.2 kg)   Body mass index is 25.73 kg/m.  BP Readings from Last 3 Encounters:  11/04/21 126/74  10/15/20 128/72  07/30/20 128/88    Wt Readings from Last 3 Encounters:  11/04/21 168 lb (76.2 kg)  10/15/20 169 lb (76.7 kg)  08/06/20 165 lb (74.8 kg)       Physical Exam Constitutional: She appears well-developed and well-nourished. No distress.  HENT:  Head: Normocephalic and atraumatic.  Right Ear: External ear  normal. Normal ear canal and TM Left Ear: External ear normal.  Normal ear canal and TM Mouth/Throat: Oropharynx is clear and moist.  Eyes: Conjunctivae normal.  Neck: Neck supple. No tracheal deviation present. No thyromegaly present.  No carotid bruit  Cardiovascular: Normal rate, regular rhythm and normal heart sounds.   No murmur heard.  No edema. Pulmonary/Chest: Effort normal and breath sounds normal. No respiratory distress. She has no wheezes. She has no rales.  Breast: deferred   Abdominal: Soft. She exhibits no distension. There is no tenderness.  Lymphadenopathy: She has no cervical adenopathy.  Skin: Skin is warm and dry. She is not diaphoretic.  Psychiatric: She has a normal mood and affect. Her behavior is normal.     Lab Results  Component Value Date   WBC 5.3 10/15/2020   HGB 13.5 10/15/2020   HCT 39.9 10/15/2020   PLT 285.0 10/15/2020   GLUCOSE 97 10/15/2020   CHOL 208 (H) 02/04/2021   TRIG 70.0 02/04/2021   HDL 52.60 02/04/2021   LDLCALC 141 (H) 02/04/2021   ALT 27 10/15/2020   AST 23 10/15/2020   NA 140 10/15/2020   K 4.3 10/15/2020   CL 104 10/15/2020   CREATININE 0.67 10/15/2020   BUN 16 10/15/2020   CO2 28 10/15/2020   TSH 1.02 10/15/2020  The 10-year ASCVD risk score (Arnett DK, et al., 2019) is: 3.8%   Values used to calculate the score:     Age: 89 years     Sex: Female     Is Non-Hispanic African American: No     Diabetic: No     Tobacco smoker: No     Systolic Blood Pressure: 194 mmHg     Is BP treated: No     HDL Cholesterol: 52.6 mg/dL     Total Cholesterol: 208 mg/dL      Assessment & Plan:   Physical exam: Screening blood work  ordered Exercise  bike rides, gym, walk, yoga, hiking Weight  normal  Substance abuse  none   Reviewed recommended immunizations.  Flu and tdap today   Health Maintenance  Topic Date Due   COLONOSCOPY (Pts 45-79yrs Insurance coverage will need to be confirmed)  Never done   Zoster Vaccines-  Shingrix (1 of 2) Never done   INFLUENZA VACCINE  Never done   TETANUS/TDAP  08/24/2021   COVID-19 Vaccine (2 - Mixed Product series) 11/20/2021 (Originally 05/12/2019)   MAMMOGRAM  12/07/2022   PAP SMEAR-Modifier  12/19/2024   HPV VACCINES  Aged Out   Hepatitis C Screening  Discontinued   HIV Screening  Discontinued          See Problem List for Assessment and Plan of chronic medical problems.

## 2021-11-04 ENCOUNTER — Encounter: Payer: Self-pay | Admitting: Internal Medicine

## 2021-11-04 ENCOUNTER — Ambulatory Visit (INDEPENDENT_AMBULATORY_CARE_PROVIDER_SITE_OTHER): Payer: BC Managed Care – PPO | Admitting: Internal Medicine

## 2021-11-04 ENCOUNTER — Telehealth: Payer: Self-pay

## 2021-11-04 VITALS — BP 126/74 | HR 74 | Temp 98.1°F | Ht 67.75 in | Wt 168.0 lb

## 2021-11-04 DIAGNOSIS — E782 Mixed hyperlipidemia: Secondary | ICD-10-CM

## 2021-11-04 DIAGNOSIS — Z23 Encounter for immunization: Secondary | ICD-10-CM | POA: Diagnosis not present

## 2021-11-04 DIAGNOSIS — Z0000 Encounter for general adult medical examination without abnormal findings: Secondary | ICD-10-CM

## 2021-11-04 DIAGNOSIS — Z13820 Encounter for screening for osteoporosis: Secondary | ICD-10-CM

## 2021-11-04 LAB — COMPREHENSIVE METABOLIC PANEL
ALT: 31 U/L (ref 0–35)
AST: 25 U/L (ref 0–37)
Albumin: 4.7 g/dL (ref 3.5–5.2)
Alkaline Phosphatase: 57 U/L (ref 39–117)
BUN: 23 mg/dL (ref 6–23)
CO2: 26 mEq/L (ref 19–32)
Calcium: 9.7 mg/dL (ref 8.4–10.5)
Chloride: 103 mEq/L (ref 96–112)
Creatinine, Ser: 0.7 mg/dL (ref 0.40–1.20)
GFR: 93.27 mL/min (ref >60.00)
Glucose, Bld: 98 mg/dL (ref 70–99)
Potassium: 3.9 mEq/L (ref 3.5–5.1)
Sodium: 139 mEq/L (ref 135–145)
Total Bilirubin: 0.5 mg/dL (ref 0.2–1.2)
Total Protein: 7.5 g/dL (ref 6.0–8.3)

## 2021-11-04 LAB — CBC WITH DIFFERENTIAL/PLATELET
Basophils Absolute: 0 10*3/uL (ref 0.0–0.1)
Basophils Relative: 0.7 % (ref 0.0–3.0)
Eosinophils Absolute: 0.1 10*3/uL (ref 0.0–0.7)
Eosinophils Relative: 1.5 % (ref 0.0–5.0)
HCT: 40 % (ref 36.0–46.0)
Hemoglobin: 13.5 g/dL (ref 12.0–15.0)
Lymphocytes Relative: 41 % (ref 12.0–46.0)
Lymphs Abs: 2.2 10*3/uL (ref 0.7–4.0)
MCHC: 33.8 g/dL (ref 30.0–36.0)
MCV: 86.3 fl (ref 78.0–100.0)
Monocytes Absolute: 0.3 10*3/uL (ref 0.1–1.0)
Monocytes Relative: 6.2 % (ref 3.0–12.0)
Neutro Abs: 2.7 10*3/uL (ref 1.4–7.7)
Neutrophils Relative %: 50.6 % (ref 43.0–77.0)
Platelets: 295 10*3/uL (ref 150.0–400.0)
RBC: 4.64 Mil/uL (ref 3.87–5.11)
RDW: 13.5 % (ref 11.5–15.5)
WBC: 5.4 10*3/uL (ref 4.0–10.5)

## 2021-11-04 LAB — LIPID PANEL
Cholesterol: 295 mg/dL — ABNORMAL HIGH (ref 0–200)
HDL: 71.3 mg/dL (ref >39.00)
LDL Cholesterol: 199 mg/dL — ABNORMAL HIGH (ref 0–99)
NonHDL: 223.63
Total CHOL/HDL Ratio: 4
Triglycerides: 122 mg/dL (ref 0.0–149.0)
VLDL: 24.4 mg/dL (ref 0.0–40.0)

## 2021-11-04 LAB — TSH: TSH: 1.44 u[IU]/mL (ref 0.35–5.50)

## 2021-11-04 NOTE — Addendum Note (Signed)
Addended by: Marcina Millard on: 11/04/2021 08:51 AM   Modules accepted: Orders

## 2021-11-04 NOTE — Telephone Encounter (Signed)
Spoke with patient today. 

## 2021-11-04 NOTE — Assessment & Plan Note (Addendum)
Chronic Regular exercise and healthy diet encouraged Check lipid panel, CMP, TSH Continue red yeast rice and lifestyle control Can consider Ct coronary calcium score

## 2021-11-04 NOTE — Telephone Encounter (Signed)
Yes - if she has never had one - I will order it for [facility] since that is where she does the mammogram - can call to schedule it the same day.

## 2021-12-12 DIAGNOSIS — Z1231 Encounter for screening mammogram for malignant neoplasm of breast: Secondary | ICD-10-CM | POA: Diagnosis not present

## 2021-12-12 LAB — HM MAMMOGRAPHY

## 2022-01-02 ENCOUNTER — Encounter: Payer: Self-pay | Admitting: Internal Medicine

## 2022-01-02 NOTE — Progress Notes (Signed)
Outside notes received. Information abstracted. Notes sent to scan.  

## 2022-01-15 DIAGNOSIS — Z780 Asymptomatic menopausal state: Secondary | ICD-10-CM | POA: Diagnosis not present

## 2022-01-15 DIAGNOSIS — M8589 Other specified disorders of bone density and structure, multiple sites: Secondary | ICD-10-CM | POA: Diagnosis not present

## 2022-01-15 LAB — HM DEXA SCAN

## 2022-01-23 ENCOUNTER — Other Ambulatory Visit: Payer: Self-pay

## 2022-01-23 ENCOUNTER — Encounter: Payer: Self-pay | Admitting: Internal Medicine

## 2022-01-23 ENCOUNTER — Ambulatory Visit (INDEPENDENT_AMBULATORY_CARE_PROVIDER_SITE_OTHER): Payer: BC Managed Care – PPO | Admitting: Internal Medicine

## 2022-01-23 VITALS — BP 124/78 | HR 77 | Temp 98.4°F | Ht 67.75 in | Wt 164.0 lb

## 2022-01-23 DIAGNOSIS — N3001 Acute cystitis with hematuria: Secondary | ICD-10-CM | POA: Diagnosis not present

## 2022-01-23 DIAGNOSIS — R300 Dysuria: Secondary | ICD-10-CM

## 2022-01-23 DIAGNOSIS — Z136 Encounter for screening for cardiovascular disorders: Secondary | ICD-10-CM | POA: Diagnosis not present

## 2022-01-23 LAB — POC URINALSYSI DIPSTICK (AUTOMATED)
Bilirubin, UA: NEGATIVE
Glucose, UA: NEGATIVE
Ketones, UA: NEGATIVE
Nitrite, UA: NEGATIVE
Protein, UA: POSITIVE — AB
Spec Grav, UA: 1.015 (ref 1.010–1.025)
Urobilinogen, UA: 0.2 E.U./dL
pH, UA: 6 (ref 5.0–8.0)

## 2022-01-23 MED ORDER — CEPHALEXIN 500 MG PO CAPS: 500.0000 mg | ORAL_CAPSULE | Freq: Two times a day (BID) | ORAL | 0 refills | Status: DC

## 2022-01-23 NOTE — Progress Notes (Signed)
    Subjective:    Patient ID: Judy Schneider, female    DOB: 06/07/1960, 62 y.o.   MRN: [id number]      HPI Judy Schneider is here for  Chief Complaint  Patient presents with   Urinary Tract Infection    Frequency and burning: Patient wanted to know about Bone density results (I didn't see them in the chart); Would like Cardiac Score test ordered      ? UTI:  Her symptoms started today - this morning. She states dysuria, pressure after urinating, urinary frequency, urinary urgency.  She denies hematuria, abdominal pain, back pain, nausea, fever.   Never had a UTI.     Medications and allergies reviewed with patient and updated if appropriate.  Current Outpatient Medications on File Prior to Visit  Medication Sig Dispense Refill   co-enzyme Q-10 30 MG capsule Take 1 capsule (30 mg total) by mouth 3 (three) times daily.     Magnesium 100 MG CAPS Taking daily  0   Omega-3 Fatty Acids (FISH OIL) 1000 MG CAPS Taking dialy  0   Red Yeast Rice 600 MG CAPS  (Patient not taking: Reported on 01/23/2022)     No current facility-administered medications on file prior to visit.    Review of Systems     Objective:   Vitals:   01/23/22 1022  BP: 124/78  Pulse: 77  Temp: 98.4 F (36.9 C)  SpO2: 99%   BP Readings from Last 3 Encounters:  01/23/22 124/78  11/04/21 126/74  10/15/20 128/72   Wt Readings from Last 3 Encounters:  01/23/22 164 lb (74.4 kg)  11/04/21 168 lb (76.2 kg)  10/15/20 169 lb (76.7 kg)   Body mass index is 25.12 kg/m.    Physical Exam Constitutional:      General: She is not in acute distress.    Appearance: Normal appearance. She is not ill-appearing.  HENT:     Head: Normocephalic.  Eyes:     Conjunctiva/sclera: Conjunctivae normal.  Abdominal:     General: There is no distension.     Palpations: Abdomen is soft.     Tenderness: There is no abdominal tenderness. There is no right CVA tenderness, left CVA tenderness, guarding or rebound.  Skin:     General: Skin is warm and dry.  Neurological:     Mental Status: She is alert.            Assessment & Plan:    See Problem List for Assessment and Plan of chronic medical problems.

## 2022-01-23 NOTE — Assessment & Plan Note (Signed)
Acute This is her first UTI Urine dip consistent with UTI Will send urine for culture Take the antibiotic as prescribed.  Keflex 500 mg twice daily x 1 week Take tylenol if needed.   Increase your water intake.  Call if no improvement

## 2022-01-23 NOTE — Assessment & Plan Note (Signed)
We discussed in the past that her cholesterol is elevated which can elevate her risk of heart disease and also discussed a calcium coronary artery score test-she would like to have this done and is thinking about starting a statin CT coronary artery calcium test ordered

## 2022-01-23 NOTE — Patient Instructions (Signed)
Take the antibiotic as prescribed.  Take tylenol if needed.     Increase your water intake.   Call if no improvement     Urinary Tract Infection, Adult A urinary tract infection (UTI) is an infection of any part of the urinary tract, which includes the kidneys, ureters, bladder, and urethra. These organs make, store, and get rid of urine in the body. UTI can be a bladder infection (cystitis) or kidney infection (pyelonephritis). What are the causes? This infection may be caused by fungi, viruses, or bacteria. Bacteria are the most common cause of UTIs. This condition can also be caused by repeated incomplete emptying of the bladder during urination. What increases the risk? This condition is more likely to develop if:  You ignore your need to urinate or hold urine for long periods of time.  You do not empty your bladder completely during urination.  You wipe back to front after urinating or having a bowel movement, if you are female.  You are uncircumcised, if you are female.  You are constipated.  You have a urinary catheter that stays in place (indwelling).  You have a weak defense (immune) system.  You have a medical condition that affects your bowels, kidneys, or bladder.  You have diabetes.  You take antibiotic medicines frequently or for long periods of time, and the antibiotics no longer work well against certain types of infections (antibiotic resistance).  You take medicines that irritate your urinary tract.  You are exposed to chemicals that irritate your urinary tract.  You are female.  What are the signs or symptoms? Symptoms of this condition include:  Fever.  Frequent urination or passing small amounts of urine frequently.  Needing to urinate urgently.  Pain or burning with urination.  Urine that smells bad or unusual.  Cloudy urine.  Pain in the lower abdomen or back.  Trouble urinating.  Blood in the urine.  Vomiting or being less hungry than  normal.  Diarrhea or abdominal pain.  Vaginal discharge, if you are female.  How is this diagnosed? This condition is diagnosed with a medical history and physical exam. You will also need to provide a urine sample to test your urine. Other tests may be done, including:  Blood tests.  Sexually transmitted disease (STD) testing.  If you have had more than one UTI, a cystoscopy or imaging studies may be done to determine the cause of the infections. How is this treated? Treatment for this condition often includes a combination of two or more of the following:  Antibiotic medicine.  Other medicines to treat less common causes of UTI.  Over-the-counter medicines to treat pain.  Drinking enough water to stay hydrated.  Follow these instructions at home:  Take over-the-counter and prescription medicines only as told by your health care provider.  If you were prescribed an antibiotic, take it as told by your health care provider. Do not stop taking the antibiotic even if you start to feel better.  Avoid alcohol, caffeine, tea, and carbonated beverages. They can irritate your bladder.  Drink enough fluid to keep your urine clear or pale yellow.  Keep all follow-up visits as told by your health care provider. This is important.  Make sure to: ? Empty your bladder often and completely. Do not hold urine for long periods of time. ? Empty your bladder before and after sex. ? Wipe from front to back after a bowel movement if you are female. Use each tissue one time when you   wipe. Contact a health care provider if:  You have back pain.  You have a fever.  You feel nauseous or vomit.  Your symptoms do not get better after 3 days.  Your symptoms go away and then return. Get help right away if:  You have severe back pain or lower abdominal pain.  You are vomiting and cannot keep down any medicines or water. This information is not intended to replace advice given to you by  your health care provider. Make sure you discuss any questions you have with your health care provider. Document Released: 10/01/2004 Document Revised: 06/05/2015 Document Reviewed: 11/12/2014 Elsevier Interactive Patient Education  2018 [employer]   

## 2022-01-25 LAB — URINE CULTURE

## 2022-01-29 ENCOUNTER — Encounter: Payer: Self-pay | Admitting: Internal Medicine

## 2022-01-29 DIAGNOSIS — L578 Other skin changes due to chronic exposure to nonionizing radiation: Secondary | ICD-10-CM | POA: Diagnosis not present

## 2022-01-29 DIAGNOSIS — L821 Other seborrheic keratosis: Secondary | ICD-10-CM | POA: Diagnosis not present

## 2022-01-29 DIAGNOSIS — D1801 Hemangioma of skin and subcutaneous tissue: Secondary | ICD-10-CM | POA: Diagnosis not present

## 2022-01-29 DIAGNOSIS — L814 Other melanin hyperpigmentation: Secondary | ICD-10-CM | POA: Diagnosis not present

## 2022-01-29 NOTE — Progress Notes (Signed)
Outside notes received. Information abstracted. Notes sent to scan. 

## 2022-02-03 ENCOUNTER — Ambulatory Visit (HOSPITAL_COMMUNITY)
Admission: RE | Admit: 2022-02-03 | Discharge: 2022-02-03 | Disposition: A | Discharge status: Home / Self Care | Payer: BC Managed Care – PPO | Source: Ambulatory Visit | Attending: Internal Medicine | Admitting: Internal Medicine

## 2022-02-03 DIAGNOSIS — Z136 Encounter for screening for cardiovascular disorders: Secondary | ICD-10-CM | POA: Insufficient documentation

## 2022-02-06 DIAGNOSIS — B379 Candidiasis, unspecified: Secondary | ICD-10-CM | POA: Diagnosis not present

## 2022-02-06 DIAGNOSIS — R300 Dysuria: Secondary | ICD-10-CM | POA: Diagnosis not present

## 2022-02-06 DIAGNOSIS — N898 Other specified noninflammatory disorders of vagina: Secondary | ICD-10-CM | POA: Diagnosis not present

## 2022-03-05 ENCOUNTER — Encounter: Payer: Self-pay | Admitting: Internal Medicine

## 2022-03-05 DIAGNOSIS — M8580 Other specified disorders of bone density and structure, unspecified site: Secondary | ICD-10-CM | POA: Insufficient documentation

## 2022-04-09 DIAGNOSIS — Z01419 Encounter for gynecological examination (general) (routine) without abnormal findings: Secondary | ICD-10-CM | POA: Diagnosis not present

## 2022-04-09 DIAGNOSIS — Z124 Encounter for screening for malignant neoplasm of cervix: Secondary | ICD-10-CM | POA: Diagnosis not present

## 2022-04-09 DIAGNOSIS — Z803 Family history of malignant neoplasm of breast: Secondary | ICD-10-CM | POA: Diagnosis not present

## 2022-07-16 ENCOUNTER — Encounter: Payer: Self-pay | Admitting: Internal Medicine

## 2022-09-01 ENCOUNTER — Telehealth: Payer: Self-pay | Admitting: Internal Medicine

## 2022-09-01 NOTE — Telephone Encounter (Signed)
Pt called to why she was receiving a call about a Bone Density test but she had one this year. Please advise.

## 2022-09-01 NOTE — Telephone Encounter (Signed)
Left message for patient to disregard message as I am not sure who called her (no documentation in chart).  She had bone scan done Jan of this year and is not due for another for for 2 years.

## 2022-10-15 ENCOUNTER — Encounter: Payer: Self-pay | Admitting: Family Medicine

## 2022-10-15 ENCOUNTER — Ambulatory Visit: Payer: 59 | Admitting: Family Medicine

## 2022-10-15 VITALS — BP 132/88 | Ht 68.0 in | Wt 160.0 lb

## 2022-10-15 DIAGNOSIS — M25562 Pain in left knee: Secondary | ICD-10-CM

## 2022-10-15 DIAGNOSIS — M47812 Spondylosis without myelopathy or radiculopathy, cervical region: Secondary | ICD-10-CM | POA: Insufficient documentation

## 2022-10-15 MED ORDER — PREDNISONE 10 MG PO TABS: ORAL_TABLET | ORAL | 0 refills | Status: DC

## 2022-10-15 NOTE — Patient Instructions (Signed)
Your neck pain is due to acute aggravation of your underlying neck arthritis - I sent a prescription for prednisone to your pharmacy that she will take over the next 10 days to help with inflammation and pain.  You should try to take this with food - You can continue to use your Flexeril as needed.  If you need a refill can reach out to let us know - Okay to continue using heat or ice as needed -Your home exercises for the neck that she had learned previously. -You should follow-up in 2 to 3 weeks if no improvement, do not hesitate to reach out with any questions or concerns  Your left lateral knee pain is likely related to distal hamstring irritation -The prednisone for your neck should also help with this -You should continue to use your knee sleeve with activities other than biking -You can use heat or ice as needed -If not improving the next up would be referring you for formal physical therapy.  If you are interested in this you can reach out and let us know  Do not hesitate to reach out with any questions or concerns

## 2022-10-15 NOTE — Assessment & Plan Note (Addendum)
Acute on chronic neck pain with known cervical spondylosis of C5-C6.  Acute exacerbation due to recent prolonged bike ride, likely aggravated by her head position, as well as bumpy trail -No red flags  Plan: 1.  Prior notes from visits with Dr. Margaretha Sheffield and July 2022 reviewed 2.  Imaging: Reviewed C-spine x-rays from July 2022 as noted above, images were independently interpreted by me today 3.  Rx prednisone taper 10 mg to take as directed over the next 10 days.  Should take with food 4.  Can continue Flexeril 10 mg half tab twice daily to 3 times daily as needed, was advised may cause drowsiness. 5.  She will resume her home exercise program which she had done previously 6.  Okay to ease back into riding as symptoms improve, recommend starting with shorter duration rides on flat/smooth surfaces when possible 7.  Follow-up 2 to 3 weeks if no improvement, if not improving would consider return to formal physical therapy versus advanced imaging with MRI 8.  Patient expressed understanding and agreement, all questions were answered

## 2022-10-15 NOTE — Progress Notes (Signed)
DATE OF VISIT: 10/15/2022         Almira Bar DOB: [birth year] MRN: [id number]  CC:  neck pain, left knee pain  History of present Illness: Judy Schneider is a 62 y.o. female who presents complaining of acute on chronic neck pain. Known hx of cervical spondylosis Last seen for neck pain 07/31/20 by Dr Margaretha Sheffield Jillyn Hidden at that time showing severe C5-C6 degenerative changes - was given HEP, Prednisone taper, Rx Flexeril prn and improved - advised to consider MRI if not improving  Has been doing well until recently. Is an avid bike rider, regularly rides 100+ miles a week Completed several 100 mile bike ride from [place] to [place] mainly on trails a few weeks ago, pain started to flare after that ride.  Denies any specific injury or trauma Pain along the left side of the neck, will occasionally radiate up towards the head, but does not radiate into the arm.  No associated radicular symptoms. Denies numbness or tingling.  Denies any upper extremity weakness Has been using Flexeril 10 mg half tab twice daily as needed, does not cause drowsiness Has been using ibuprofen 400 mg 2-3 times a day as needed as well Has tried using heat and ice which have been helpful Has a drive to [place] next week, will be there for over a week.  They will be splitting up the drive with a stop in [place] most likely No new injury or trauma No recent imaging No recent PT, but she does remember home exercises she was given before.  Patient also complains of intermittent left lateral knee pain. Pain along the posterior lateral aspect of the knee.  No specific injury or trauma.  Sometimes bothersome when biking, other times when twisting or bending the knee.  Will sometimes bother her at night. Denies any swelling.  Denies any clicking or catching Using knee sleeve intermittently with walking, it is too uncomfortable for her to wear when riding bike. Had prior knee x-ray May 2015 which was normal.  No recent  imaging  Past Medical History: History reviewed. No pertinent past medical history.  Past Surgical History: History reviewed. No pertinent surgical history.  Medications:  Outpatient Encounter Medications as of 10/15/2022  Medication Sig   cyclobenzaprine (FLEXERIL) 10 MG tablet Take 5 mg by mouth 3 (three) times daily as needed for muscle spasms.   predniSONE (DELTASONE) 10 MG tablet 5 tab PO x 2 days, then 4 tab PO x 2 days, then 3 tab PO x 2 days, then 2 tab PO x 2 days, then 1 tab PO x 2 days.  Take with food.   cephALEXin (KEFLEX) 500 MG capsule Take 1 capsule (500 mg total) by mouth 2 (two) times daily.   co-enzyme Q-10 30 MG capsule Take 1 capsule (30 mg total) by mouth 3 (three) times daily.   Magnesium 100 MG CAPS Taking daily   Omega-3 Fatty Acids (FISH OIL) 1000 MG CAPS Taking dialy   No facility-administered encounter medications on file as of 10/15/2022.    Allergies: has No Known Allergies.  Social History:   reports current alcohol use.  reports that she has never smoked. She has never been exposed to tobacco smoke. She has never used smokeless tobacco.  reports no history of drug use.   EXAM: Vitals: BP 132/88   Ht 5\' 8"  (1.727 m)   Wt 160 lb (72.6 kg)   BMI 24.33 kg/m  General: AOx3, NAD, pleasant SKIN: no rashes or lesions,  skin clean, dry, intact MSK: Cervical spine: Screening exam of the cervical spine reveals decreased ROM - rotation to 30-deg b/l with pain, slightly decreased flexion and extension with pain. no midline tenderness, (+)left-sided paraspinal tenderness. Spurling's test neg b/l.  normal upper extremity strength bilaterally.  normal grip strength, normal intrinsic/extrinsic hand strength.    Shoulder: Clean/Dry/ Intact skin. No rashes. full range of motion without pain.  Shoulder and arm strength  5/5 bilaterallty.    Knee: Left knee without swelling or effusion.  Full range of motion without pain.  Mild hypertonia along the distal hamstring  near attachment on the proximal fibular head.  No palpable defects.  No medial or lateral joint line tenderness.  Negative McMurray.  Right knee with full range of motion without pain.  Walking without a limp.  NEURO: sensation intact to light touch, DTR +2/4 biceps, triceps, brachioradialis bilaterally VASC: pulses 2+ and symmetric radial artery bilaterally, no edema  IMAGING: XRAYS:   Cervical spine AP/Lateral/odontoid views from 07/30/20 showing:  - severe C5-C6 degenerative changes - images personally reviewed and interpreted by me today  Right knee x-ray AP/lateral/sunrise 05/12/2013 showing: - No acute abnormalities  Assessment & Plan Cervical spondylosis without myelopathy Acute on chronic neck pain with known cervical spondylosis of C5-C6.  Acute exacerbation due to recent prolonged bike ride, likely aggravated by her head position, as well as bumpy trail -No red flags  Plan: 1.  Prior notes from visits with Dr. Margaretha Sheffield and July 2022 reviewed 2.  Imaging: Reviewed C-spine x-rays from July 2022 as noted above, images were independently interpreted by me today 3.  Rx prednisone taper 10 mg to take as directed over the next 10 days.  Should take with food 4.  Can continue Flexeril 10 mg half tab twice daily to 3 times daily as needed, was advised may cause drowsiness. 5.  She will resume her home exercise program which she had done previously 6.  Okay to ease back into riding as symptoms improve, recommend starting with shorter duration rides on flat/smooth surfaces when possible 7.  Follow-up 2 to 3 weeks if no improvement, if not improving would consider return to formal physical therapy versus advanced imaging with MRI 8.  Patient expressed understanding and agreement, all questions were answered Acute pain of left knee Acute left lateral knee pain most consistent with distal hamstring irritation, likely related to her bike riding.  No signs of meniscus injury on exam  Plan:   Explained likely related to her biking, she does use clips.  So the pedal motion will load the hamstrings, as well as the motion getting in and out of the clips. Did discuss considering knee sleeve with bike riding, but she notes that this is uncomfortable.  Did recommend she wear this with walking and other activities Discussed referral to physical therapy for 1-2 sessions for instruction on home exercise program, she would like to see how she responds to the prednisone to see if that helps Can use heat or ice as needed Will follow-up if worsening or not improving, otherwise as needed.  She can reach out if she is interested in formal PT referral Patient expressed understanding agreement with above    Encounter Diagnoses  Name Primary?   Cervical spondylosis without myelopathy Yes   Acute pain of left knee     No orders of the defined types were placed in this encounter.   No orders of the defined types were placed in this encounter.

## 2022-11-21 IMAGING — CR DG CERVICAL SPINE 2 OR 3 VIEWS
4 series · 4 of 4 positions shown · non-contrast
Comparison: None.

CLINICAL DATA: Atraumatic neck pain.

EXAM:
CERVICAL SPINE - 2-3 VIEW

[w c-spine lat]
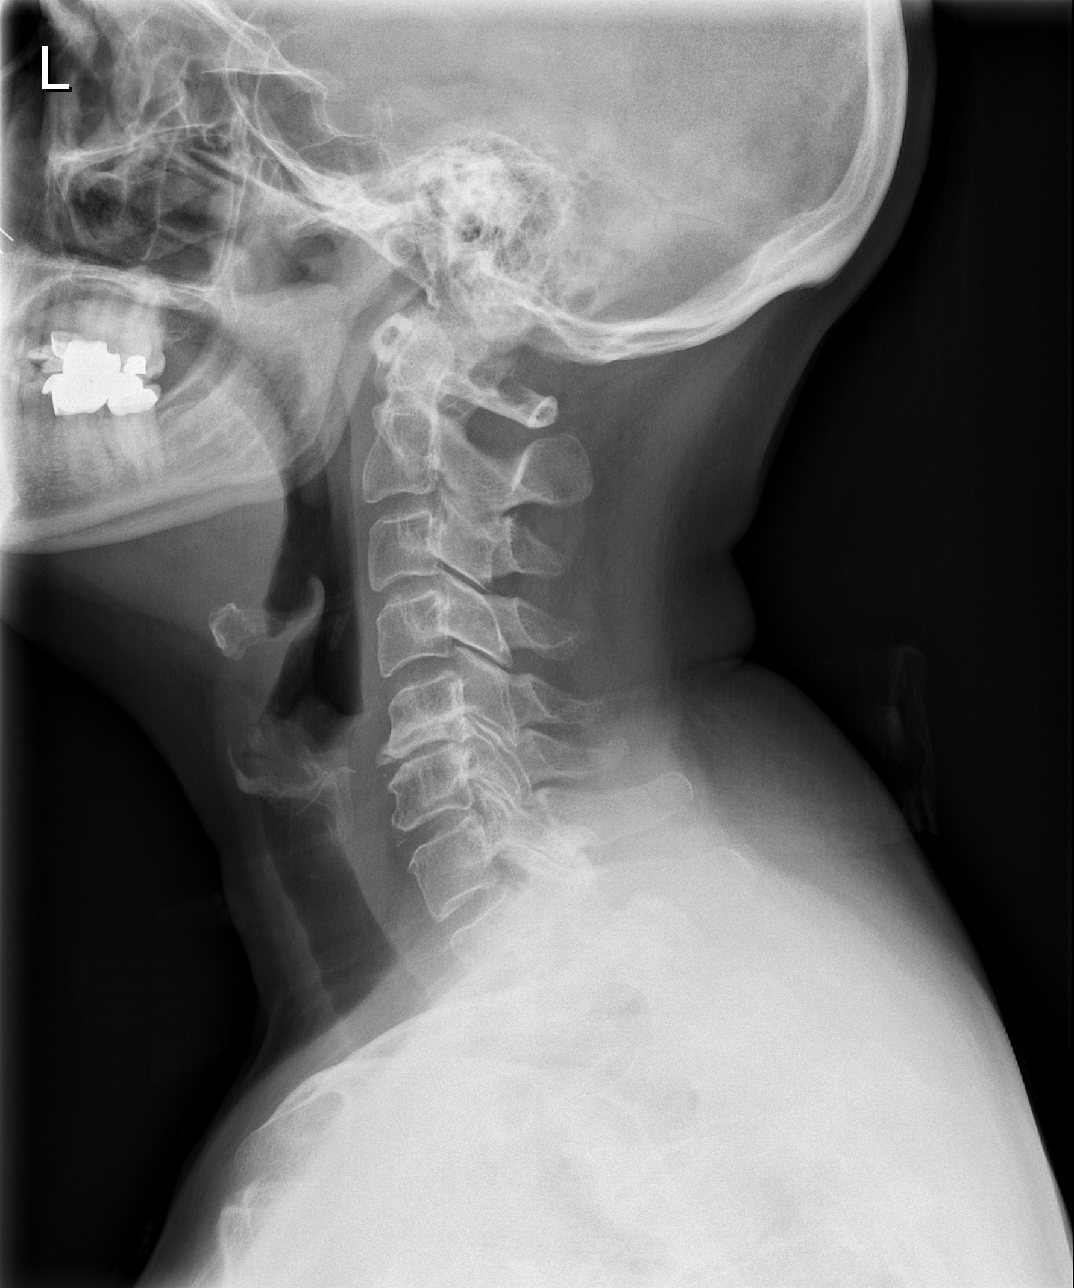

[w c-spine a.p. *]
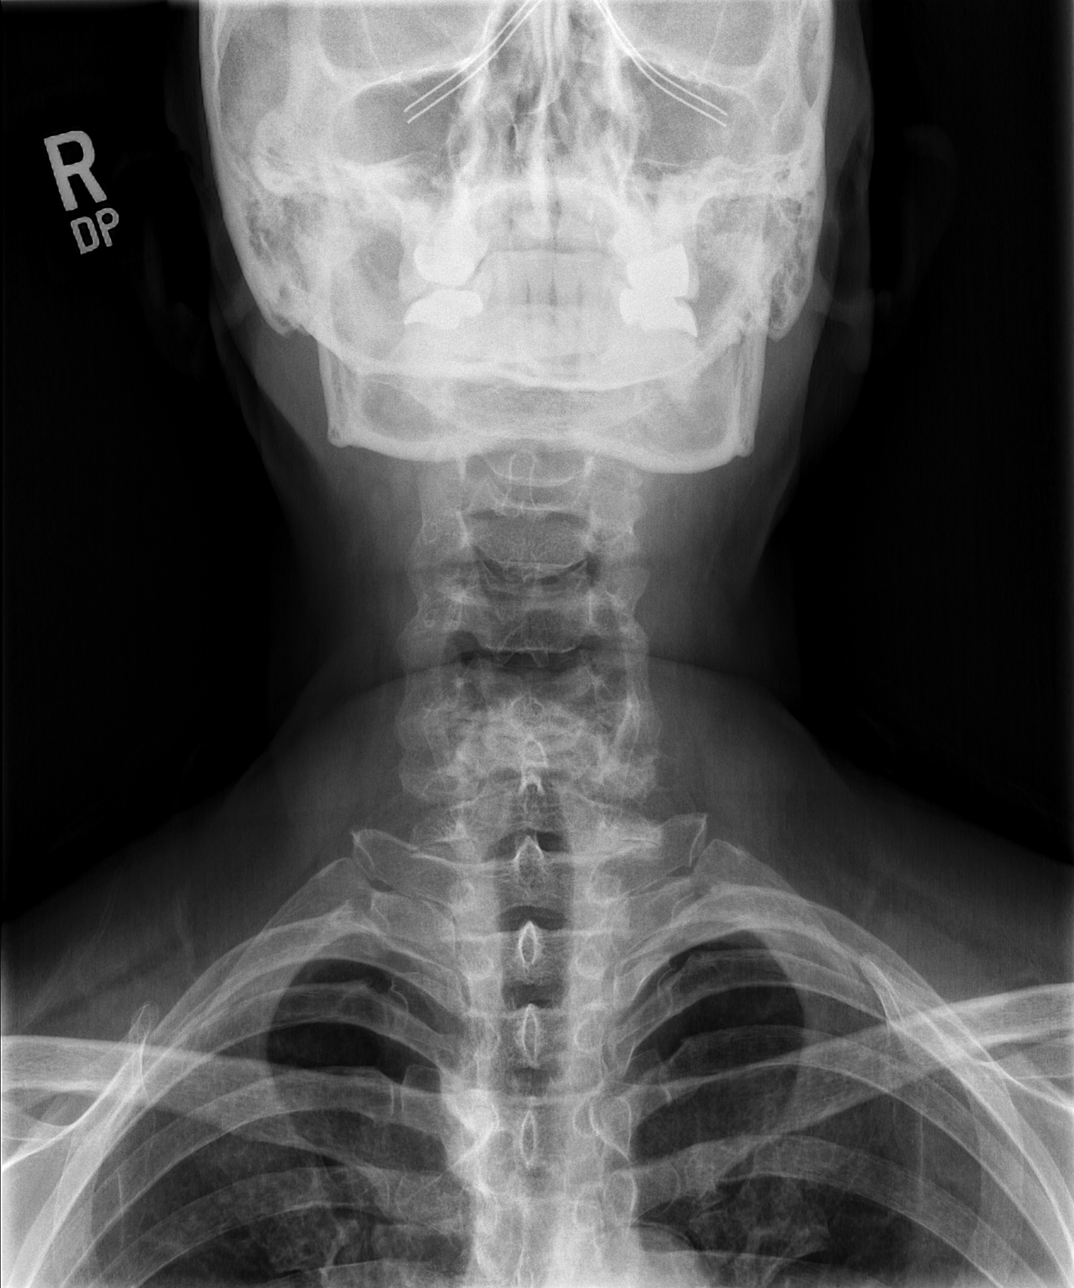

[w c-spine odontoid * (1 of 2)]
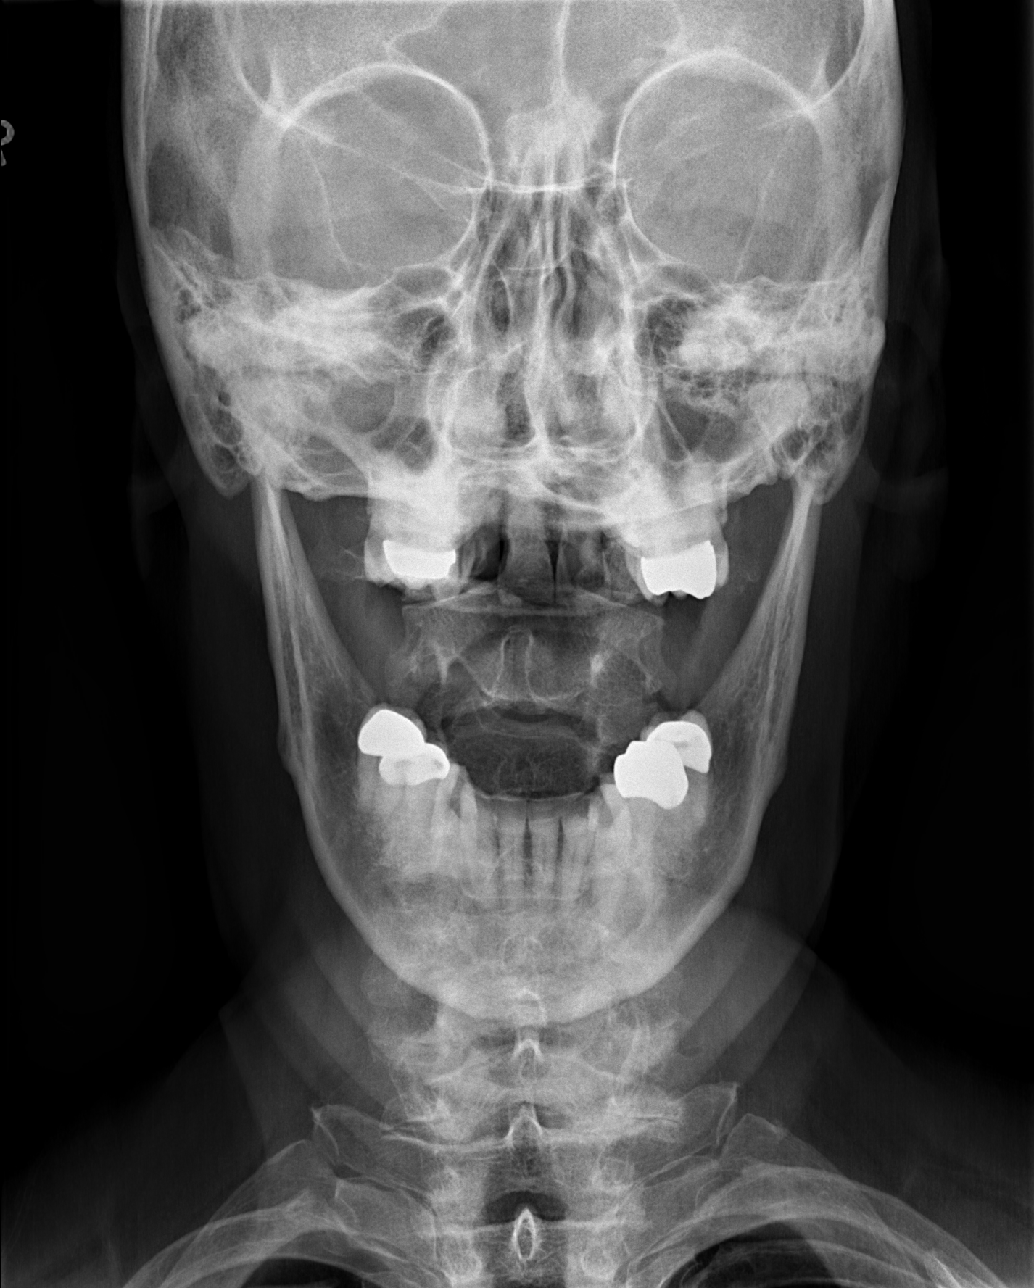

[w c-spine odontoid * (2 of 2)]
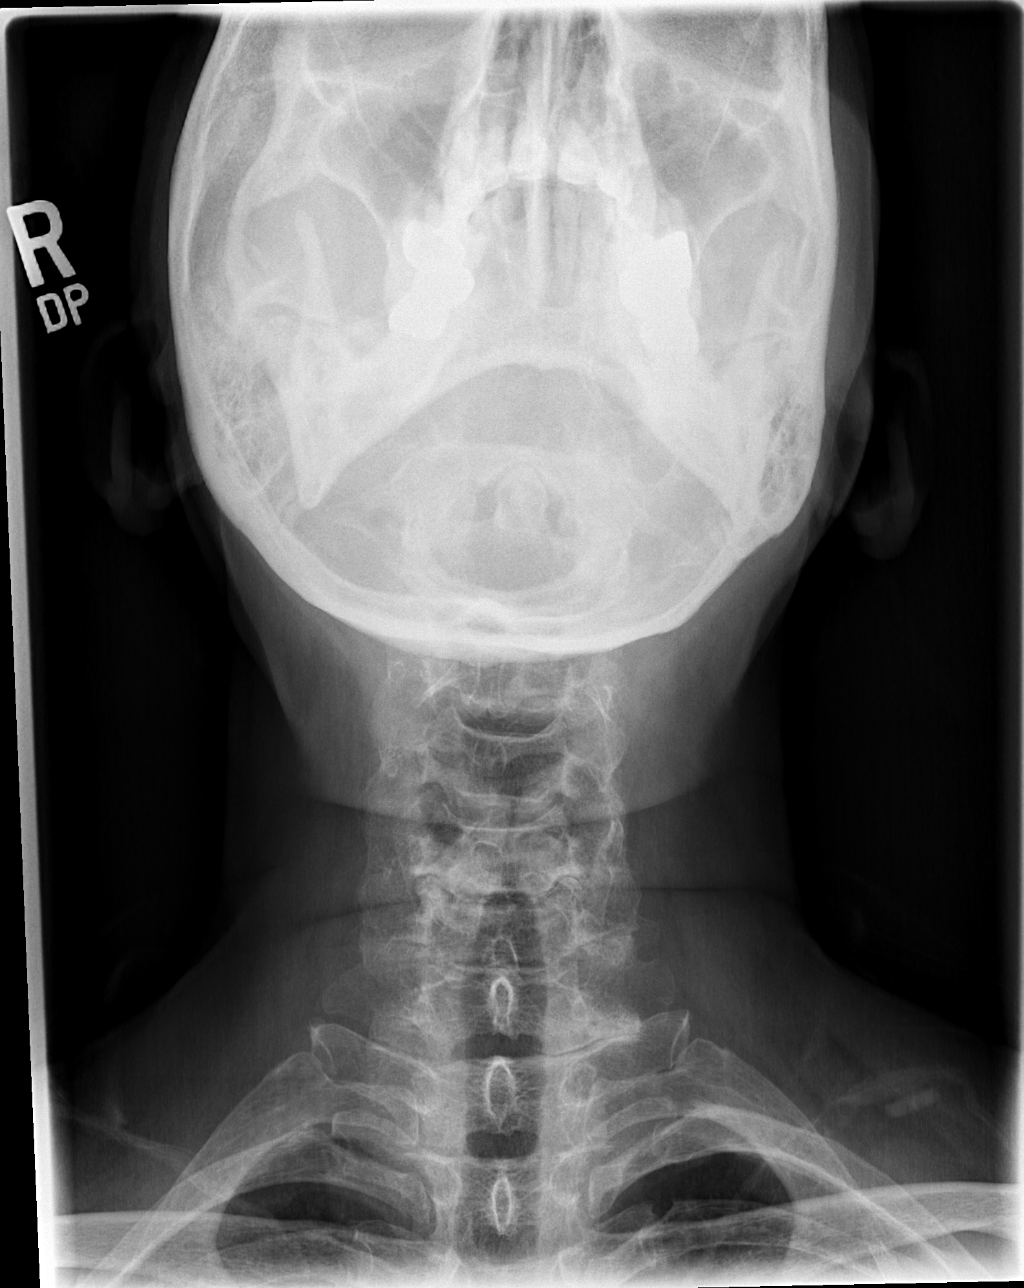

[4 of 4 positions shown; findings below may reference images not displayed]

FINDINGS: There is no evidence of cervical spine fracture or prevertebral soft
tissue swelling. Alignment is normal. Mild to moderate severity
endplate sclerosis and mild anterior osteophyte formation is seen at
the level of C5-C6. Moderate to marked severity intervertebral disc
space narrowing is also seen at the level of C5-C6. No other
significant bone abnormalities are identified.
IMPRESSION: Moderate to marked severity degenerative changes at the level of
C5-C6.

## 2022-12-23 DIAGNOSIS — Z1231 Encounter for screening mammogram for malignant neoplasm of breast: Secondary | ICD-10-CM | POA: Diagnosis not present

## 2022-12-23 LAB — HM MAMMOGRAPHY

## 2022-12-24 ENCOUNTER — Encounter: Payer: Self-pay | Admitting: Internal Medicine

## 2023-01-26 ENCOUNTER — Other Ambulatory Visit: Payer: Self-pay | Admitting: Medical Genetics

## 2023-01-28 ENCOUNTER — Other Ambulatory Visit (HOSPITAL_COMMUNITY)
Admission: RE | Admit: 2023-01-28 | Discharge: 2023-01-28 | Disposition: A | Discharge status: Home / Self Care | Payer: Self-pay | Source: Ambulatory Visit | Attending: Oncology | Admitting: Oncology

## 2023-02-07 ENCOUNTER — Encounter: Payer: Self-pay | Admitting: Internal Medicine

## 2023-02-07 DIAGNOSIS — R739 Hyperglycemia, unspecified: Secondary | ICD-10-CM | POA: Insufficient documentation

## 2023-02-07 DIAGNOSIS — R7303 Prediabetes: Secondary | ICD-10-CM | POA: Insufficient documentation

## 2023-02-07 NOTE — Progress Notes (Signed)
 Subjective:    Patient ID: Judy Schneider, female    DOB: 1960/05/08, 63 y.o.   MRN: [id number]      HPI Judy Schneider is here for a Physical exam and her chronic medical problems.    Both knees get tight at times.  6 times in the past 9 months her left knee feels like there is a pulled muscle in the back of the knee - she has difficulty walking -- it stretches out after a little while.  She ices it.  It lasts 2 days but really limits her activity.    In Jan did  whole30 and a very well, but cannot do that long-term.  She wonders if her cholesterol will be better because of that.  Exercising regularly.  Medications and allergies reviewed with patient and updated if appropriate.  Current Outpatient Medications on File Prior to Visit  Medication Sig Dispense Refill   Magnesium  100 MG CAPS Taking daily  0   Multiple Vitamin (MULTI-VITAMIN DAILY) TABS      No current facility-administered medications on file prior to visit.    Review of Systems  Constitutional:  Negative for fever.  Eyes:  Negative for visual disturbance.  Respiratory:  Negative for cough, shortness of breath and wheezing.   Cardiovascular:  Negative for chest pain, palpitations and leg swelling.  Gastrointestinal:  Negative for abdominal pain, blood in stool, constipation and diarrhea.       No gerd  Genitourinary:  Negative for dysuria.  Musculoskeletal:  Positive for arthralgias (left knee), neck pain and neck stiffness. Negative for back pain.  Skin:  Negative for rash.  Neurological:  Negative for light-headedness and headaches.  Psychiatric/Behavioral:  Negative for dysphoric mood. The patient is not nervous/anxious.        Objective:   Vitals:   02/10/23 0802  BP: 130/74  Pulse: 60  Temp: 98 F (36.7 C)  SpO2: 98%   Filed Weights   02/10/23 0802  Weight: 164 lb (74.4 kg)   Body mass index is 25.31 kg/m.  BP Readings from Last 3 Encounters:  02/10/23 130/74  10/15/22 132/88  01/23/22 124/78     Wt Readings from Last 3 Encounters:  02/10/23 164 lb (74.4 kg)  10/15/22 160 lb (72.6 kg)  01/23/22 164 lb (74.4 kg)       Physical Exam Constitutional: She appears well-developed and well-nourished. No distress.  HENT:  Head: Normocephalic and atraumatic.  Right Ear: External ear normal. Normal ear canal and TM Left Ear: External ear normal.  Normal ear canal and TM Mouth/Throat: Oropharynx is clear and moist.  Eyes: Conjunctivae normal.  Neck: Neck supple. No tracheal deviation present. No thyromegaly present.  No carotid bruit  Cardiovascular: Normal rate, regular rhythm and normal heart sounds.   No murmur heard.  No edema. Pulmonary/Chest: Effort normal and breath sounds normal. No respiratory distress. She has no wheezes. She has no rales.  Breast: deferred   Abdominal: Soft. She exhibits no distension. There is no tenderness.  Lymphadenopathy: She has no cervical adenopathy.  Skin: Skin is warm and dry. She is not diaphoretic.  Psychiatric: She has a normal mood and affect. Her behavior is normal.     Lab Results  Component Value Date   WBC 5.4 11/04/2021   HGB 13.5 11/04/2021   HCT 40.0 11/04/2021   PLT 295.0 11/04/2021   GLUCOSE 98 11/04/2021   CHOL 295 (H) 11/04/2021   TRIG 122.0 11/04/2021   HDL 71.30 11/04/2021  LDLCALC 199 (H) 11/04/2021   ALT 31 11/04/2021   AST 25 11/04/2021   NA 139 11/04/2021   K 3.9 11/04/2021   CL 103 11/04/2021   CREATININE 0.70 11/04/2021   BUN 23 11/04/2021   CO2 26 11/04/2021   TSH 1.44 11/04/2021    The 10-year ASCVD risk score (Arnett DK, et al., 2019) is: 4.7%   Values used to calculate the score:     Age: 53 years     Sex: Female     Is Non-Hispanic African American: No     Diabetic: No     Tobacco smoker: No     Systolic Blood Pressure: 130 mmHg     Is BP treated: No     HDL Cholesterol: 71.3 mg/dL     Total Cholesterol: 295 mg/dL      Assessment & Plan:   Physical exam: Screening blood work   ordered Exercise  walking, yoga, biking Weight  is normal Substance abuse  none   Reviewed recommended immunizations.   Health Maintenance  Topic Date Due   Zoster Vaccines- Shingrix (1 of 2) Never done   Cervical Cancer Screening (HPV/Pap Cotest)  Never done   INFLUENZA VACCINE  08/06/2022   COVID-19 Vaccine (2 - Mixed Product risk series) 02/26/2023 (Originally 04/14/2019)   DEXA SCAN  01/16/2024   MAMMOGRAM  12/22/2024   Colonoscopy  01/14/2025   DTaP/Tdap/Td (3 - Td or Tdap) 11/05/2031   HPV VACCINES  Aged Out   Hepatitis C Screening  Discontinued   HIV Screening  Discontinued          See Problem List for Assessment and Plan of chronic medical problems.

## 2023-02-07 NOTE — Patient Instructions (Addendum)
 Blood work was ordered.       Medications changes include :   None     Return in about 1 year (around 02/10/2024) for Physical Exam.    Health Maintenance, Female Adopting a healthy lifestyle and getting preventive care are important in promoting health and wellness. Ask your health care provider about: The right schedule for you to have regular tests and exams. Things you can do on your own to prevent diseases and keep yourself healthy. What should I know about diet, weight, and exercise? Eat a healthy diet  Eat a diet that includes plenty of vegetables, fruits, low-fat dairy products, and lean protein. Do not eat a lot of foods that are high in solid fats, added sugars, or sodium. Maintain a healthy weight Body mass index (BMI) is used to identify weight problems. It estimates body fat based on height and weight. Your health care provider can help determine your BMI and help you achieve or maintain a healthy weight. Get regular exercise Get regular exercise. This is one of the most important things you can do for your health. Most adults should: Exercise for at least 150 minutes each week. The exercise should increase your heart rate and make you sweat (moderate-intensity exercise). Do strengthening exercises at least twice a week. This is in addition to the moderate-intensity exercise. Spend less time sitting. Even light physical activity can be beneficial. Watch cholesterol and blood lipids Have your blood tested for lipids and cholesterol at 63 years of age, then have this test every 5 years. Have your cholesterol levels checked more often if: Your lipid or cholesterol levels are high. You are older than 63 years of age. You are at high risk for heart disease. What should I know about cancer screening? Depending on your health history and family history, you may need to have cancer screening at various ages. This may include screening for: Breast cancer. Cervical  cancer. Colorectal cancer. Skin cancer. Lung cancer. What should I know about heart disease, diabetes, and high blood pressure? Blood pressure and heart disease High blood pressure causes heart disease and increases the risk of stroke. This is more likely to develop in people who have high blood pressure readings or are overweight. Have your blood pressure checked: Every 3-5 years if you are 64-73 years of age. Every year if you are 31 years old or older. Diabetes Have regular diabetes screenings. This checks your fasting blood sugar level. Have the screening done: Once every three years after age 61 if you are at a normal weight and have a low risk for diabetes. More often and at a younger age if you are overweight or have a high risk for diabetes. What should I know about preventing infection? Hepatitis B If you have a higher risk for hepatitis B, you should be screened for this virus. Talk with your health care provider to find out if you are at risk for hepatitis B infection. Hepatitis C Testing is recommended for: Everyone born from [birth year] through 1965. Anyone with known risk factors for hepatitis C. Sexually transmitted infections (STIs) Get screened for STIs, including gonorrhea and chlamydia, if: You are sexually active and are younger than 63 years of age. You are older than 63 years of age and your health care provider tells you that you are at risk for this type of infection. Your sexual activity has changed since you were last screened, and you are at increased risk for chlamydia or  gonorrhea. Ask your health care provider if you are at risk. Ask your health care provider about whether you are at high risk for HIV. Your health care provider may recommend a prescription medicine to help prevent HIV infection. If you choose to take medicine to prevent HIV, you should first get tested for HIV. You should then be tested every 3 months for as long as you are taking the  medicine. Pregnancy If you are about to stop having your period (premenopausal) and you may become pregnant, seek counseling before you get pregnant. Take 400 to 800 micrograms (mcg) of folic acid every day if you become pregnant. Ask for birth control (contraception) if you want to prevent pregnancy. Osteoporosis and menopause Osteoporosis is a disease in which the bones lose minerals and strength with aging. This can result in bone fractures. If you are 53 years old or older, or if you are at risk for osteoporosis and fractures, ask your health care provider if you should: Be screened for bone loss. Take a calcium or vitamin D  supplement to lower your risk of fractures. Be given hormone replacement therapy (HRT) to treat symptoms of menopause. Follow these instructions at home: Alcohol use Do not drink alcohol if: Your health care provider tells you not to drink. You are pregnant, may be pregnant, or are planning to become pregnant. If you drink alcohol: Limit how much you have to: 0-1 drink a day. Know how much alcohol is in your drink. In the [place] one drink equals one 12 oz bottle of beer (355 mL), one 5 oz glass of wine (148 mL), or one 1 oz glass of hard liquor (44 mL). Lifestyle Do not use any products that contain nicotine or tobacco. These products include cigarettes, chewing tobacco, and vaping devices, such as e-cigarettes. If you need help quitting, ask your health care provider. Do not use street drugs. Do not share needles. Ask your health care provider for help if you need support or information about quitting drugs. General instructions Schedule regular health, dental, and eye exams. Stay current with your vaccines. Tell your health care provider if: You often feel depressed. You have ever been abused or do not feel safe at home. Summary Adopting a healthy lifestyle and getting preventive care are important in promoting health and wellness. Follow your health care  provider's instructions about healthy diet, exercising, and getting tested or screened for diseases. Follow your health care provider's instructions on monitoring your cholesterol and blood pressure. This information is not intended to replace advice given to you by your health care provider. Make sure you discuss any questions you have with your health care provider. Document Revised: 05/13/2020 Document Reviewed: 05/13/2020 Elsevier Patient Education  2024 [employer].

## 2023-02-09 LAB — GENECONNECT MOLECULAR SCREEN: Genetic Analysis Overall Interpretation: NEGATIVE

## 2023-02-10 ENCOUNTER — Ambulatory Visit (INDEPENDENT_AMBULATORY_CARE_PROVIDER_SITE_OTHER): Payer: 59 | Admitting: Internal Medicine

## 2023-02-10 VITALS — BP 130/74 | HR 60 | Temp 98.0°F | Ht 67.5 in | Wt 164.0 lb

## 2023-02-10 DIAGNOSIS — Z0000 Encounter for general adult medical examination without abnormal findings: Secondary | ICD-10-CM

## 2023-02-10 DIAGNOSIS — R739 Hyperglycemia, unspecified: Secondary | ICD-10-CM

## 2023-02-10 DIAGNOSIS — M25562 Pain in left knee: Secondary | ICD-10-CM | POA: Diagnosis not present

## 2023-02-10 DIAGNOSIS — M8589 Other specified disorders of bone density and structure, multiple sites: Secondary | ICD-10-CM | POA: Diagnosis not present

## 2023-02-10 DIAGNOSIS — E782 Mixed hyperlipidemia: Secondary | ICD-10-CM | POA: Diagnosis not present

## 2023-02-10 LAB — COMPREHENSIVE METABOLIC PANEL
ALT: 19 U/L (ref 0–35)
AST: 21 U/L (ref 0–37)
Albumin: 4.8 g/dL (ref 3.5–5.2)
Alkaline Phosphatase: 57 U/L (ref 39–117)
BUN: 17 mg/dL (ref 6–23)
CO2: 25 meq/L (ref 19–32)
Calcium: 9.4 mg/dL (ref 8.4–10.5)
Chloride: 103 meq/L (ref 96–112)
Creatinine, Ser: 0.73 mg/dL (ref 0.40–1.20)
GFR: 87.9 mL/min (ref >60.00)
Glucose, Bld: 95 mg/dL (ref 70–99)
Potassium: 3.8 meq/L (ref 3.5–5.1)
Sodium: 140 meq/L (ref 135–145)
Total Bilirubin: 0.6 mg/dL (ref 0.2–1.2)
Total Protein: 7.4 g/dL (ref 6.0–8.3)

## 2023-02-10 LAB — CBC WITH DIFFERENTIAL/PLATELET
Basophils Absolute: 0 K/uL (ref 0.0–0.1)
Basophils Relative: 0.8 % (ref 0.0–3.0)
Eosinophils Absolute: 0 K/uL (ref 0.0–0.7)
Eosinophils Relative: 0.7 % (ref 0.0–5.0)
HCT: 40.6 % (ref 36.0–46.0)
Hemoglobin: 13.8 g/dL (ref 12.0–15.0)
Lymphocytes Relative: 41.4 % (ref 12.0–46.0)
Lymphs Abs: 1.7 K/uL (ref 0.7–4.0)
MCHC: 34.1 g/dL (ref 30.0–36.0)
MCV: 86.2 fl (ref 78.0–100.0)
Monocytes Absolute: 0.2 K/uL (ref 0.1–1.0)
Monocytes Relative: 5.9 % (ref 3.0–12.0)
Neutro Abs: 2.1 K/uL (ref 1.4–7.7)
Neutrophils Relative %: 51.2 % (ref 43.0–77.0)
Platelets: 303 K/uL (ref 150.0–400.0)
RBC: 4.7 Mil/uL (ref 3.87–5.11)
RDW: 12.9 % (ref 11.5–15.5)
WBC: 4.1 K/uL (ref 4.0–10.5)

## 2023-02-10 LAB — LIPID PANEL
Cholesterol: 221 mg/dL — ABNORMAL HIGH (ref 0–200)
HDL: 57.6 mg/dL (ref >39.00)
LDL Cholesterol: 150 mg/dL — ABNORMAL HIGH (ref 0–99)
NonHDL: 163.53
Total CHOL/HDL Ratio: 4
Triglycerides: 66 mg/dL (ref 0.0–149.0)
VLDL: 13.2 mg/dL (ref 0.0–40.0)

## 2023-02-10 LAB — HEMOGLOBIN A1C: Hgb A1c MFr Bld: 5.8 % (ref 4.6–6.5)

## 2023-02-10 LAB — TSH: TSH: 1.14 u[IU]/mL (ref 0.35–5.50)

## 2023-02-10 LAB — VITAMIN D 25 HYDROXY (VIT D DEFICIENCY, FRACTURES): VITD: 42.57 ng/mL (ref 30.00–100.00)

## 2023-02-10 NOTE — Assessment & Plan Note (Signed)
 Chronic Regular exercise and healthy diet encouraged Check lipid panel, CMP, TSH, cbc Continue lifestyle control ASCVD risk below Can consider Ct coronary calcium score

## 2023-02-10 NOTE — Assessment & Plan Note (Addendum)
 Chronic Dexa up to date Exercises regularly Taking MVI Check vitamin D  level Advised 1200 mg of calcium between multivitamin and food

## 2023-02-10 NOTE — Assessment & Plan Note (Signed)
Chronic Check a1c Low sugar / carb diet Stressed regular exercise  

## 2023-02-10 NOTE — Assessment & Plan Note (Addendum)
 Chronic Intermittent pain on the lateral aspect- lasts a couple of days Pain does not feel like a spasm and it is difficult for her to describe Does have slight decreased extension ? Cause -arthritis versus other Deferred referral to sports medicine, but will let me know if she wants to be seen

## 2023-02-11 ENCOUNTER — Encounter: Payer: Self-pay | Admitting: Internal Medicine

## 2023-12-24 DIAGNOSIS — Z1231 Encounter for screening mammogram for malignant neoplasm of breast: Secondary | ICD-10-CM | POA: Diagnosis not present

## 2023-12-24 LAB — HM MAMMOGRAPHY

## 2023-12-27 ENCOUNTER — Encounter: Payer: Self-pay | Admitting: Internal Medicine

## 2024-03-03 ENCOUNTER — Encounter: Admitting: Internal Medicine

## 2024-06-05 ENCOUNTER — Encounter: Admitting: Internal Medicine
